# Patient Record
Sex: Female | Born: 1955 | Race: White | Hispanic: No | Marital: Married | Smoking: Former smoker
Health system: Southern US, Community
[De-identification: ages and names within clinical notes are randomized; demographics above are authoritative.]

## PROBLEM LIST (undated history)

## (undated) DIAGNOSIS — D649 Anemia, unspecified: Secondary | ICD-10-CM

## (undated) DIAGNOSIS — F32A Depression, unspecified: Secondary | ICD-10-CM

## (undated) DIAGNOSIS — H269 Unspecified cataract: Secondary | ICD-10-CM

## (undated) DIAGNOSIS — M797 Fibromyalgia: Secondary | ICD-10-CM

## (undated) DIAGNOSIS — E039 Hypothyroidism, unspecified: Secondary | ICD-10-CM

## (undated) DIAGNOSIS — R519 Headache, unspecified: Secondary | ICD-10-CM

## (undated) DIAGNOSIS — I10 Essential (primary) hypertension: Secondary | ICD-10-CM

## (undated) DIAGNOSIS — E079 Disorder of thyroid, unspecified: Secondary | ICD-10-CM

## (undated) DIAGNOSIS — T7840XA Allergy, unspecified, initial encounter: Secondary | ICD-10-CM

## (undated) DIAGNOSIS — Z8742 Personal history of other diseases of the female genital tract: Secondary | ICD-10-CM

## (undated) HISTORY — DX: Disorder of thyroid, unspecified: E07.9

## (undated) HISTORY — DX: Essential (primary) hypertension: I10

## (undated) HISTORY — DX: Personal history of other diseases of the female genital tract: Z87.42

## (undated) HISTORY — DX: Fibromyalgia: M79.7

## (undated) HISTORY — PX: CATARACT EXTRACTION W/ INTRAOCULAR LENS IMPLANT: SHX1309

## (undated) HISTORY — DX: Depression, unspecified: F32.A

## (undated) HISTORY — DX: Allergy, unspecified, initial encounter: T78.40XA

## (undated) HISTORY — DX: Unspecified cataract: H26.9

---
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
## 1985-03-13 HISTORY — PX: DIAGNOSTIC LAPAROSCOPY: SUR761

## 1985-03-13 HISTORY — PX: EXPLORATORY LAPAROTOMY: SUR591

## 1999-05-19 ENCOUNTER — Encounter (HOSPITAL_COMMUNITY): Admission: RE | Admit: 1999-05-19 | Discharge: 1999-08-17 | Payer: Self-pay | Admitting: Family Medicine

## 1999-08-24 ENCOUNTER — Encounter (HOSPITAL_COMMUNITY): Admission: RE | Admit: 1999-08-24 | Discharge: 1999-11-22 | Payer: Self-pay | Admitting: Family Medicine

## 1999-12-07 ENCOUNTER — Encounter (HOSPITAL_COMMUNITY): Admission: RE | Admit: 1999-12-07 | Discharge: 2000-03-06 | Payer: Self-pay | Admitting: Family Medicine

## 2000-04-30 ENCOUNTER — Other Ambulatory Visit: Admission: RE | Admit: 2000-04-30 | Discharge: 2000-04-30 | Payer: Self-pay | Admitting: Obstetrics & Gynecology

## 2001-07-26 ENCOUNTER — Other Ambulatory Visit: Admission: RE | Admit: 2001-07-26 | Discharge: 2001-07-26 | Payer: Self-pay | Admitting: Obstetrics & Gynecology

## 2001-08-01 ENCOUNTER — Encounter: Admission: RE | Admit: 2001-08-01 | Discharge: 2001-08-01 | Payer: Self-pay | Admitting: Obstetrics & Gynecology

## 2001-08-01 ENCOUNTER — Encounter: Payer: Self-pay | Admitting: Obstetrics & Gynecology

## 2003-07-24 ENCOUNTER — Ambulatory Visit (HOSPITAL_COMMUNITY): Admission: RE | Admit: 2003-07-24 | Discharge: 2003-07-24 | Payer: Self-pay | Admitting: Family Medicine

## 2003-12-10 ENCOUNTER — Other Ambulatory Visit: Admission: RE | Admit: 2003-12-10 | Discharge: 2003-12-10 | Payer: Self-pay | Admitting: Family Medicine

## 2003-12-23 ENCOUNTER — Encounter (HOSPITAL_COMMUNITY): Admission: RE | Admit: 2003-12-23 | Discharge: 2004-03-12 | Payer: Self-pay | Admitting: Family Medicine

## 2004-03-15 ENCOUNTER — Encounter (HOSPITAL_COMMUNITY): Admission: RE | Admit: 2004-03-15 | Discharge: 2004-06-13 | Payer: Self-pay | Admitting: Family Medicine

## 2019-01-09 DIAGNOSIS — H25811 Combined forms of age-related cataract, right eye: Secondary | ICD-10-CM | POA: Insufficient documentation

## 2019-05-19 ENCOUNTER — Ambulatory Visit: Payer: Self-pay | Attending: Internal Medicine

## 2019-05-19 DIAGNOSIS — Z23 Encounter for immunization: Secondary | ICD-10-CM | POA: Insufficient documentation

## 2019-05-19 NOTE — Progress Notes (Signed)
   Covid-19 Vaccination Clinic  Name:  Emma Hill    MRN: [id number] DOB: [birth year]  05/19/2019  [name] was observed post Covid-19 immunization for 15 minutes without incident. She was provided with Vaccine Information Sheet and instruction to access the V-Safe system.   [name] was instructed to call 911 with any severe reactions post vaccine: Marland Kitchen Difficulty breathing  . Swelling of face and throat  . A fast heartbeat  . A bad rash all over body  . Dizziness and weakness   Immunizations Administered    Name Date Dose VIS Date Route   Pfizer COVID-19 Vaccine 05/19/2019  3:45 PM 0.3 mL 02/21/2019 Intramuscular   Manufacturer: [place]   Lot: [id number]   [employer]: [id number]

## 2019-06-18 ENCOUNTER — Ambulatory Visit: Payer: Self-pay | Attending: Internal Medicine

## 2019-06-18 DIAGNOSIS — Z23 Encounter for immunization: Secondary | ICD-10-CM

## 2019-06-18 NOTE — Progress Notes (Signed)
   Covid-19 Vaccination Clinic  Name:  KYMBERLIE DRAVIS    MRN: [id number] DOB: 03/03/1956  06/18/2019  Ms. Lookabaugh was observed post Covid-19 immunization for 15 minutes without incident. She was provided with Vaccine Information Sheet and instruction to access the V-Safe system.   Ms. Maginnis was instructed to call 911 with any severe reactions post vaccine: Marland Kitchen Difficulty breathing  . Swelling of face and throat  . A fast heartbeat  . A bad rash all over body  . Dizziness and weakness   Immunizations Administered    Name Date Dose VIS Date Route   Pfizer COVID-19 Vaccine 06/18/2019  3:16 PM 0.3 mL 02/21/2019 Intramuscular   Manufacturer: [place]   Lot: [id number]   [place]: [id number]

## 2020-06-11 DIAGNOSIS — I10 Essential (primary) hypertension: Secondary | ICD-10-CM | POA: Insufficient documentation

## 2020-06-11 DIAGNOSIS — M797 Fibromyalgia: Secondary | ICD-10-CM | POA: Insufficient documentation

## 2020-06-11 DIAGNOSIS — J302 Other seasonal allergic rhinitis: Secondary | ICD-10-CM | POA: Insufficient documentation

## 2020-06-11 DIAGNOSIS — G43909 Migraine, unspecified, not intractable, without status migrainosus: Secondary | ICD-10-CM | POA: Insufficient documentation

## 2020-06-11 DIAGNOSIS — K900 Celiac disease: Secondary | ICD-10-CM | POA: Insufficient documentation

## 2020-09-27 ENCOUNTER — Other Ambulatory Visit: Payer: Self-pay | Admitting: Obstetrics and Gynecology

## 2020-09-27 DIAGNOSIS — R1031 Right lower quadrant pain: Secondary | ICD-10-CM

## 2020-10-26 ENCOUNTER — Inpatient Hospital Stay: Admission: RE | Admit: 2020-10-26 | Payer: No Typology Code available for payment source | Source: Ambulatory Visit

## 2020-11-19 ENCOUNTER — Ambulatory Visit
Admission: RE | Admit: 2020-11-19 | Discharge: 2020-11-19 | Disposition: A | Discharge status: Home / Self Care | Payer: No Typology Code available for payment source | Source: Ambulatory Visit | Attending: Obstetrics and Gynecology | Admitting: Obstetrics and Gynecology

## 2020-11-19 DIAGNOSIS — R1031 Right lower quadrant pain: Secondary | ICD-10-CM

## 2020-11-19 IMAGING — CT CT ABD-PELV W/ CM
1 of 3 series · 14 of 32 positions shown, 19 images · IV contrast (APPLIED)
Comparison: None.

CLINICAL DATA: Right lower quadrant pain. Pelvic ultrasound
demonstrated 3.5 cm irregular right pelvic mass

EXAM:
CT ABDOMEN AND PELVIS WITH CONTRAST
TECHNIQUE: Multidetector CT imaging of the abdomen and pelvis was performed
using the standard protocol following bolus administration of
intravenous contrast.
CONTRAST:  100mL [8W] IOPAMIDOL ([8W]) INJECTION 61%

[Series 2: abd/pelvis w/cm · axial · 0.79mm/px · z∈[-477,-47]mm · 14 of 100 slices shown, 19 images]
[im 7/100  soft-tissue]
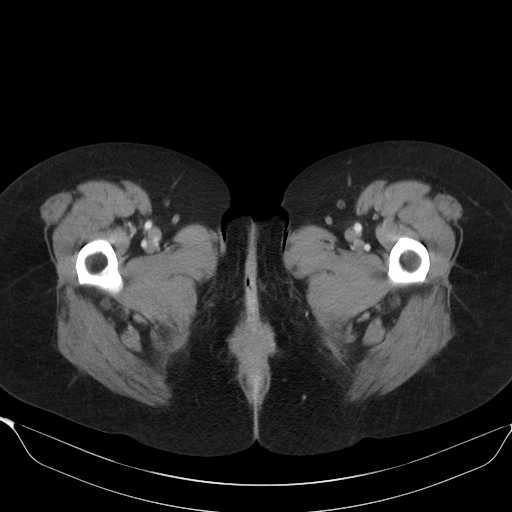
[im 7/100  bone]
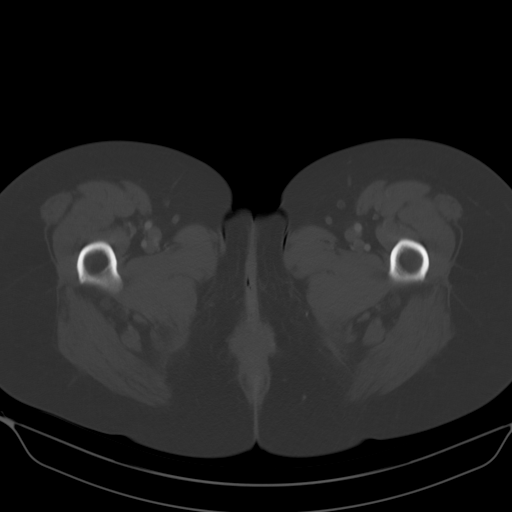
[im 13/100  soft-tissue]
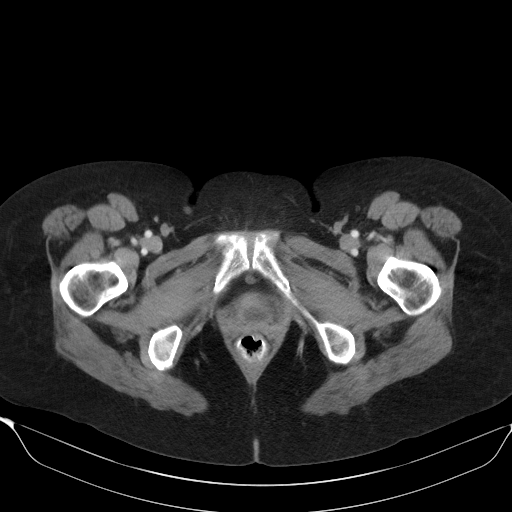
[im 19/100  soft-tissue]
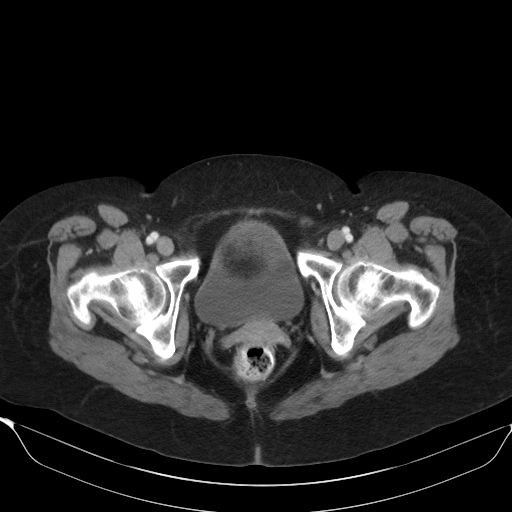
[im 31/100  soft-tissue]
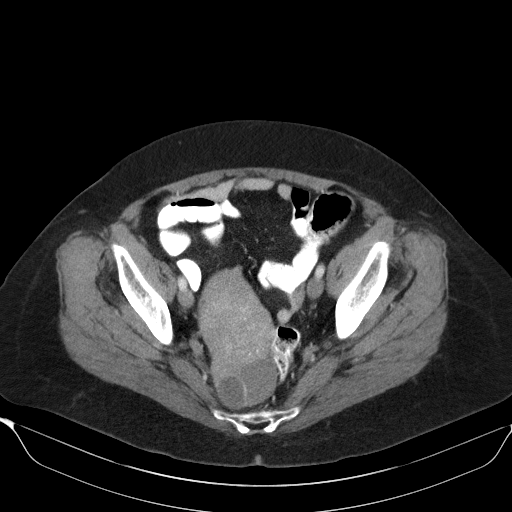
[im 38/100  soft-tissue]
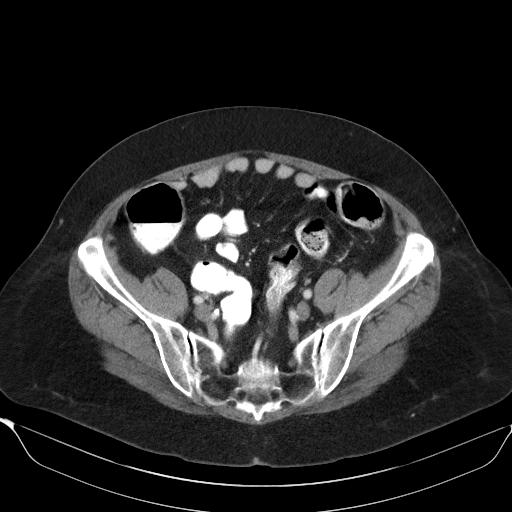
[im 44/100  soft-tissue]
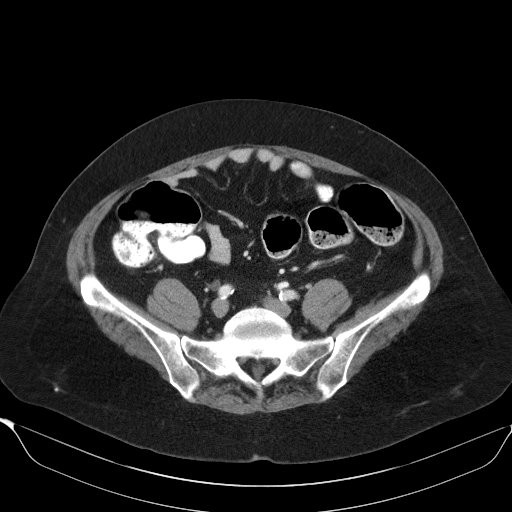
[im 50/100  soft-tissue]
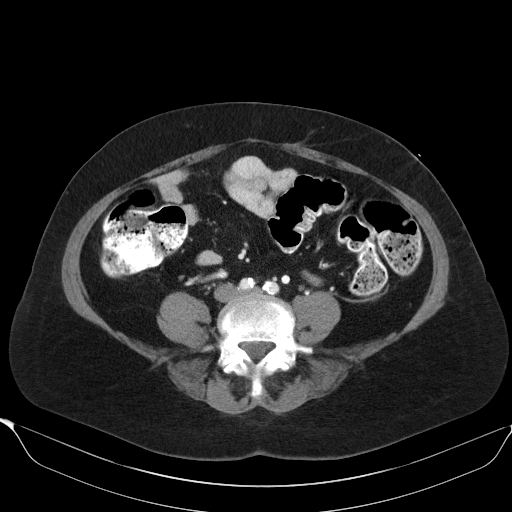
[im 56/100  soft-tissue]
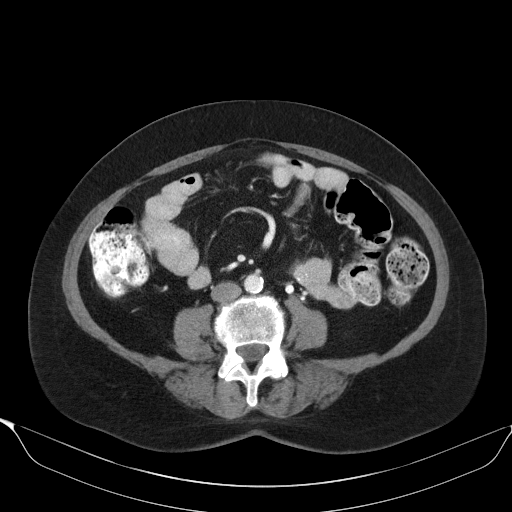
[im 62/100  soft-tissue]
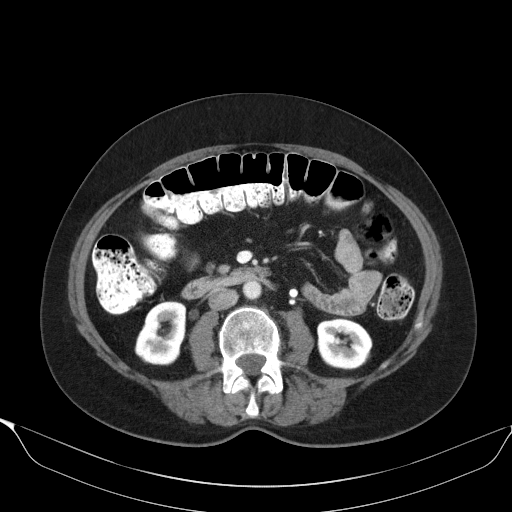
[im 62/100  bone]
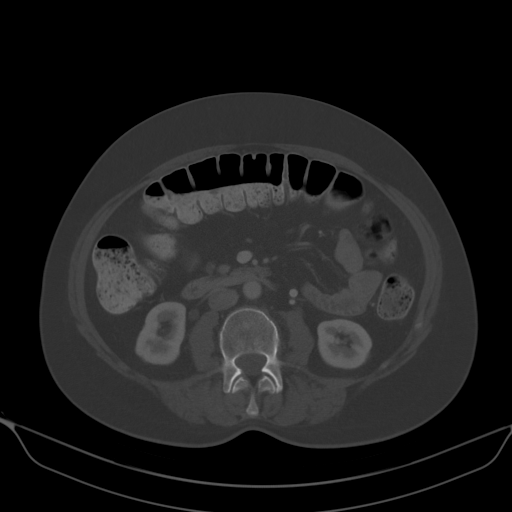
[im 69/100  soft-tissue]
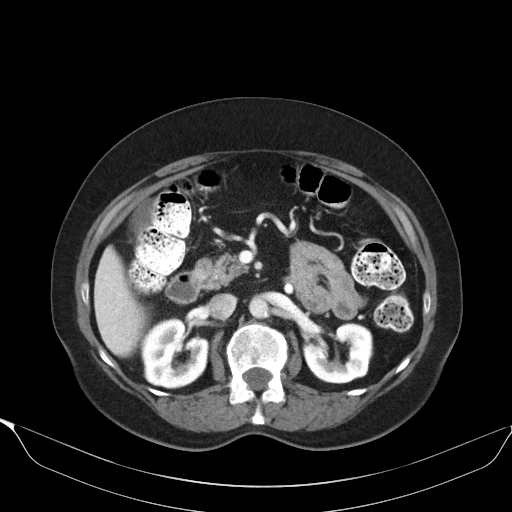
[im 75/100  lung]
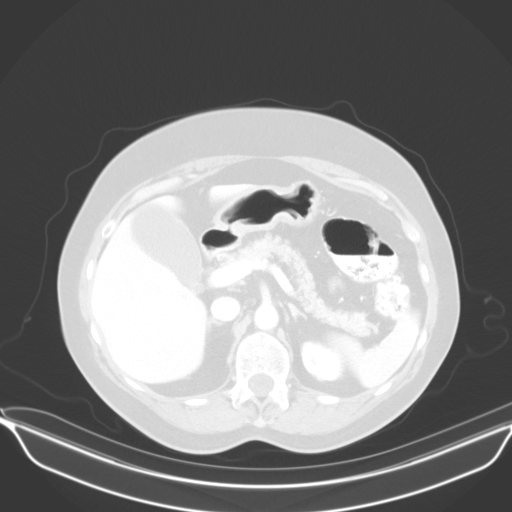
[im 81/100  soft-tissue]
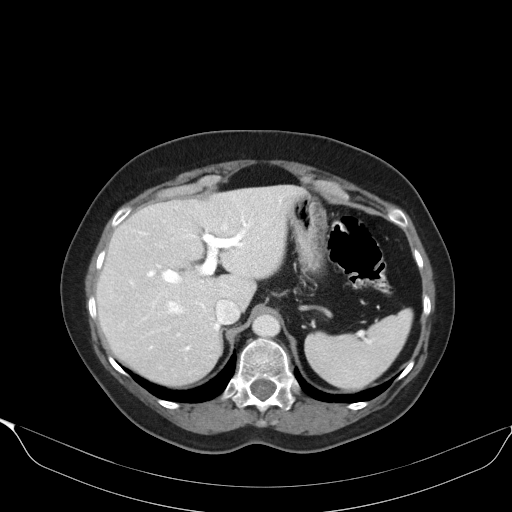
[im 81/100  lung]
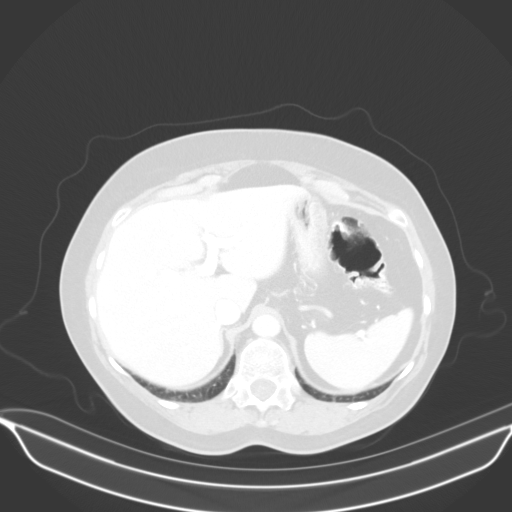
[im 87/100  soft-tissue]
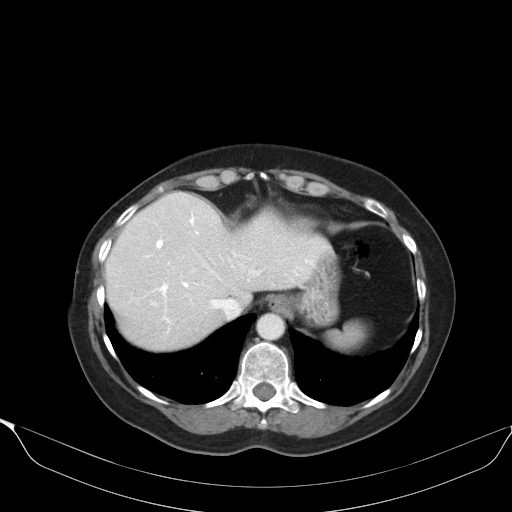
[im 87/100  lung]
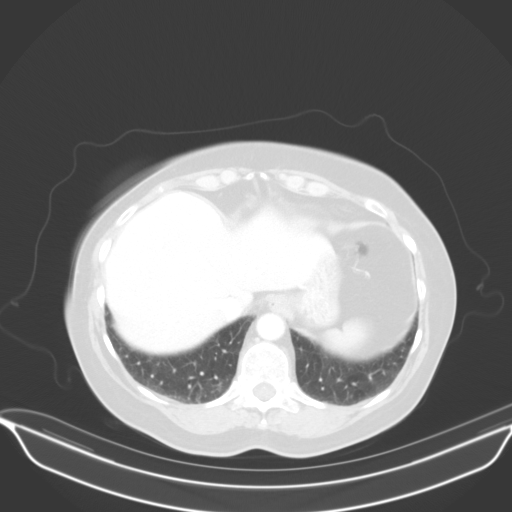
[im 93/100  soft-tissue]
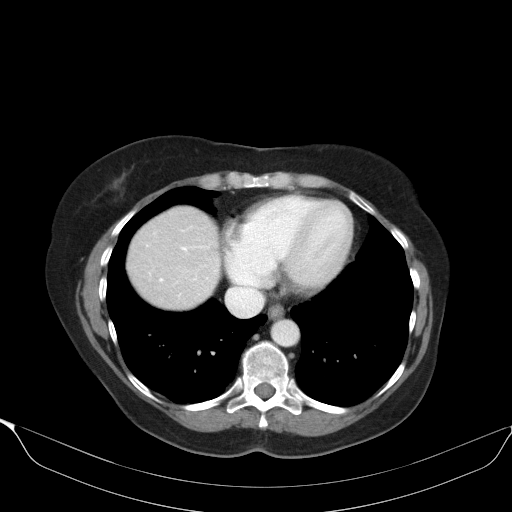
[im 93/100  lung]
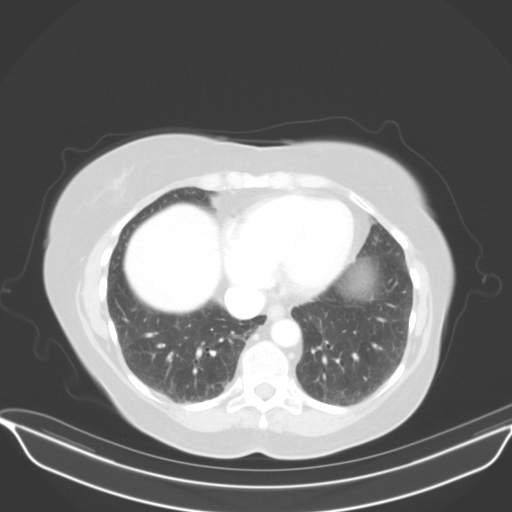

[14 of 32 positions shown; findings below may reference images not displayed]

FINDINGS: Lower chest: No acute abnormality.

Hepatobiliary: No focal hepatic abnormality. Gallbladder
unremarkable.

Pancreas: No focal abnormality or ductal dilatation.

Spleen: No focal abnormality.  Normal size.

Adrenals/Urinary Tract: No adrenal abnormality. No focal renal
abnormality. No stones or hydronephrosis. Urinary bladder is
unremarkable.

Stomach/Bowel: Stomach, large and small bowel grossly unremarkable.

Vascular/Lymphatic: Aortic atherosclerosis. No evidence of aneurysm
or adenopathy.

Reproductive: Within the right adnexal region, there is an 8.3 x
cm solid mass with central calcifications. This is intimately
associated with the right side of the uterus as well as a septated
cystic mass in the posterior pelvis which measures 4.8 x 3.4 cm and
may reflect the right ovary. It is difficult to determine if this
mass arises from the uterus or the right ovary. Left adnexa
unremarkable.

Other: No free fluid or free air.

Musculoskeletal: No acute bony abnormality.
IMPRESSION: Large solid mass in the right side of the pelvis measuring up to
cm abutting the right side of the uterus and AA 4.7 cm septated
cystic mass in the posterior pelvis, likely right ovary. It is
difficult to determine if this solid mass is arising from the uterus
or the right ovary. Recommend further evaluation with MRI.

## 2020-11-19 MED ORDER — IOPAMIDOL (ISOVUE-300) INJECTION 61%
100.0000 mL | Freq: Once | INTRAVENOUS | Status: AC | PRN
  Administered 2020-11-19: 100 mL via INTRAVENOUS

## 2020-11-24 ENCOUNTER — Telehealth: Payer: Self-pay | Admitting: *Deleted

## 2020-11-24 NOTE — Telephone Encounter (Signed)
Called and spoke with the patient, scheduled a new patient appt with Dr Berline Lopes on 9/27 at 11:15 am. Patient given the address and phone number for the clinic, along with the policy for mask/visitors

## 2020-12-02 ENCOUNTER — Other Ambulatory Visit: Payer: Self-pay | Admitting: Nurse Practitioner

## 2020-12-02 DIAGNOSIS — Z13820 Encounter for screening for osteoporosis: Secondary | ICD-10-CM

## 2020-12-02 DIAGNOSIS — Z780 Asymptomatic menopausal state: Secondary | ICD-10-CM

## 2020-12-07 ENCOUNTER — Other Ambulatory Visit: Payer: Self-pay

## 2020-12-07 ENCOUNTER — Inpatient Hospital Stay: Payer: No Typology Code available for payment source | Attending: Gynecologic Oncology | Admitting: Gynecologic Oncology

## 2020-12-07 ENCOUNTER — Encounter: Payer: Self-pay | Admitting: Gynecologic Oncology

## 2020-12-07 VITALS — BP 123/82 | HR 57 | Temp 97.7°F | Resp 16 | Ht 67.0 in | Wt 178.1 lb

## 2020-12-07 DIAGNOSIS — Z8742 Personal history of other diseases of the female genital tract: Secondary | ICD-10-CM | POA: Diagnosis not present

## 2020-12-07 DIAGNOSIS — I10 Essential (primary) hypertension: Secondary | ICD-10-CM | POA: Insufficient documentation

## 2020-12-07 DIAGNOSIS — Z780 Asymptomatic menopausal state: Secondary | ICD-10-CM | POA: Insufficient documentation

## 2020-12-07 DIAGNOSIS — Z90722 Acquired absence of ovaries, bilateral: Secondary | ICD-10-CM | POA: Insufficient documentation

## 2020-12-07 DIAGNOSIS — E079 Disorder of thyroid, unspecified: Secondary | ICD-10-CM | POA: Diagnosis not present

## 2020-12-07 DIAGNOSIS — F419 Anxiety disorder, unspecified: Secondary | ICD-10-CM | POA: Diagnosis not present

## 2020-12-07 DIAGNOSIS — M797 Fibromyalgia: Secondary | ICD-10-CM | POA: Diagnosis not present

## 2020-12-07 DIAGNOSIS — Z79899 Other long term (current) drug therapy: Secondary | ICD-10-CM | POA: Insufficient documentation

## 2020-12-07 DIAGNOSIS — N950 Postmenopausal bleeding: Secondary | ICD-10-CM | POA: Diagnosis not present

## 2020-12-07 DIAGNOSIS — R1900 Intra-abdominal and pelvic swelling, mass and lump, unspecified site: Secondary | ICD-10-CM | POA: Insufficient documentation

## 2020-12-07 MED ORDER — LORAZEPAM 0.5 MG PO TABS: 0.5000 mg | ORAL_TABLET | Freq: Three times a day (TID) | ORAL | 0 refills | Status: AC

## 2020-12-07 NOTE — Patient Instructions (Addendum)
It was a pleasure to meet you.  Given significantly different findings on ultrasound and CT scan, I recommend that we get a pelvic MRI.  CT scan is not as good at looking at pelvic organs.  Based on your exam today, I suspect that this mass is part of or attached to the uterus.  Hopefully the MRI will help [place] determine whether this is a uterine fibroid or some other type of mass.  Depending on results, we will discuss the need for surgery.  I am prescribing a one-time dose of Ativan for you to take for your MRI.  If you have any questions, please do not hesitate to call the clinic at [phone].

## 2020-12-07 NOTE — Progress Notes (Signed)
GYNECOLOGIC ONCOLOGY NEW PATIENT CONSULTATION   Patient Name: Emma Hill  Patient Age: 65 y.o. Date of Service: 12/07/2020 Referring Provider: Irene Pap, MD  Primary Care Provider: Pcp, No Consulting Provider: Jeral Pinch, MD   Assessment/Plan:  Postmenopausal patient with history significant for reportedly advanced endometriosis recently found to have solid pelvic mass with cystic component versus second pelvic mass.  The patient and I reviewed details of both her recent ultrasound as well as CT scan.  She and I looked at the CT images together in detail.  Based on my review of the images as well as what I am feeling on her pelvic exam, I do not feel distinction between the posterior mass and her uterus.  On CT images, I favor that this solid mass is a pedunculated fibroid.  The cystic component may be adnexal or may be a component of the solid mass that is degenerating.  The patient is asymptomatic.  I discussed next steps.  Given somewhat discrepant imaging findings as well as the fact that CT imaging is not a great way to assess GYN organs, I recommend that we proceed with an MRI of the pelvis.  Once we have these images, I will call the patient to discuss if and what surgery may be indicated.  Given thin lining on ultrasound and no further postmenopausal bleeding, I agree that endometrial biopsy can deferred unless she has additional postmenopausal bleeding.  We reviewed tumor markers that were obtained earlier this month including CA-125, CEA, and CA 19-9.  While all normal, we discussed the difficulty with using these tests.  CA-125 is not approved for diagnostic purposes.  It can be normal and up to 50% of patients with early stage ovarian cancer and elevated in many noncancerous disease states.  A copy of this note was sent to the patient's referring provider.   65 minutes of total time was spent for this patient encounter, including preparation, face-to-face  counseling with the patient and coordination of care, and documentation of the encounter.  Jeral Pinch, MD  Division of Gynecologic Oncology  Department of Obstetrics and Gynecology  [facility]  ___________________________________________  Chief Complaint: Chief Complaint  Patient presents with   Pelvic mass    History of Present Illness:  Emma Hill is a 65 y.o. y.o. female who is seen in consultation at the request of Dr. Brien Mates for an evaluation of a pelvic mass.  Patient reports menopause at the age of 54.  3-4 months ago, she had very light vaginal spotting for 1 week with mild cramping.  She has had no bleeding or pain since.  She went to her OB/GYN and underwent pelvic ultrasound which showed a thin endometrial lining but evidence of a solid-appearing mass in the right adnexa.  She subsequently underwent CT scan approximately 2 months later with both a solid as well as bilobed cystic mass noted.  Patient reports a good appetite without any nausea, emesis, bloating, or early satiety.  She reports regular bowel and bladder function.  Her surgical history is notable for laparoscopic surgery for infertility and suspected endometriosis.  A mass was seen (she thinks on one of her ovaries) and she had a dermoid cyst removed.  As far as she knows, she kept both ovaries at the time of that surgery.  Shortly after, she underwent laparotomy secondary to what was described as "severe endometriosis" and had endometriosis removed as well as fulgurated at that time.  Subsequently she was  able to achieve pregnancy x2 and had a C-section delivery with 1 of these.  Her endometriosis related surgeries were approximately 35 years ago.  Remembers vaguely being told something about a uterine fibroid after the surgeries.  She does not think that she has had any imaging until this year after her endometriosis surgeries.  Patient lives in [place] with her husband, her  daughter and her daughter's boyfriend.  She denies any tobacco use, reports social alcohol use.  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  PAST MEDICAL HISTORY:  Past Medical History:  Diagnosis Date   Allergy    Cataract    Depression    Fibromyalgia    History of endometriosis    Hypertension    Thyroid disease      PAST SURGICAL HISTORY:  Past Surgical History:  Procedure Laterality Date   CESAREAN SECTION  1988   DIAGNOSTIC LAPAROSCOPY  1987   EXPLORATORY LAPAROTOMY  1987   Reports diagnostic laparoscopy secondary to concern for endometriosis and infertility.  She ultimately returned for laparotomy secondary to significant endometriosis.   TONSILECTOMY, ADENOIDECTOMY, BILATERAL MYRINGOTOMY AND TUBES  1975    OB/GYN HISTORY:  OB History  Gravida Para Term Preterm AB Living  2 2          SAB IAB Ectopic Multiple Live Births               # Outcome Date GA Lbr Len/2nd Weight Sex Delivery Anes PTL Lv  2 Para           1 Para             No LMP recorded.  Age at menarche: 26 Age at menopause: 73 Hx of HRT: No Hx of STDs: Denies Last pap: 2022 -Pap negative, HPV negative History of abnormal pap smears: Reports remote history of abnormal Pap smear with cryotherapy, normal since  SCREENING STUDIES:  Last mammogram: 2022 per her report  Last colonoscopy: 2014  MEDICATIONS: Outpatient Encounter Medications as of 12/07/2020  Medication Sig   atenolol-chlorthalidone (TENORETIC) 50-25 MG tablet atenolol 50 mg-chlorthalidone 25 mg tablet  TAKE 1/2 TABLET BY MOUTH EVERY DAY   augmented betamethasone dipropionate (DIPROLENE-AF) 0.05 % cream betamethasone, augmented 0.05 % topical cream   HYDROCODONE-ACETAMINOPHEN PO hydrocodone 7.5 mg-acetaminophen 650 mg tablet  TAKE 1 TABLET BY MOUTH 4 TIMES A DAY AS NEEDED FOR PAIN   levothyroxine (SYNTHROID) 75 MCG tablet levothyroxine 75 mcg tablet  TAKE 1 TABLET BY MOUTH ON AN EMPTY STOMACH IN THE MORNING ON WEEKDAYS. 28    LORazepam (ATIVAN) 0.5 MG tablet Take 1 tablet (0.5 mg total) by mouth every 8 (eight) hours for 1 dose.   montelukast (SINGULAIR) 10 MG tablet montelukast 10 mg tablet  TAKE 1 TABLET BY MOUTH EVERY DAY AS NEEDED   pregabalin (LYRICA) 75 MG capsule Take 150 mg by mouth every morning.   tiZANidine (ZANAFLEX) 4 MG tablet tizanidine 4 mg tablet  TAKE 1 TABLET BY MOUTH EVERY 8 HOURS AS NEEDED   conjugated estrogens (PREMARIN) vaginal cream Premarin 0.625 mg/gram vaginal cream  INSERT 1 GRAM INTRAVAGINALLY TWICE WEEKLY AT BEDTIME. (Patient not taking: Reported on 12/07/2020)   [DISCONTINUED] atenolol (TENORMIN) 25 MG tablet Take 25 mg by mouth daily.   No facility-administered encounter medications on file as of 12/07/2020.    ALLERGIES:  Allergies  Allergen Reactions   Pollen Extract Cough     FAMILY HISTORY:  Family History  Problem Relation Age of Onset  Lung cancer Brother    Pancreatic cancer Brother    Colon cancer Neg Hx    Uterine cancer Neg Hx    Ovarian cancer Neg Hx    Breast cancer Neg Hx      SOCIAL HISTORY:  Social Connections: Not on file    REVIEW OF SYSTEMS:  Denies appetite changes, fevers, chills, fatigue, unexplained weight changes. Denies hearing loss, neck lumps or masses, mouth sores, ringing in ears or voice changes. Denies cough or wheezing.  Denies shortness of breath. Denies chest pain or palpitations. Denies leg swelling. Denies abdominal distention, pain, blood in stools, constipation, diarrhea, nausea, vomiting, or early satiety. Denies pain with intercourse, dysuria, frequency, hematuria or incontinence. Denies hot flashes, pelvic pain, vaginal bleeding or vaginal discharge.   Denies joint pain, back pain or muscle pain/cramps. Denies itching, rash, or wounds. Denies dizziness, headaches, numbness or seizures. Denies swollen lymph nodes or glands, denies easy bruising or bleeding. Denies anxiety, depression, confusion, or decreased  concentration.  Physical Exam:  Vital Signs for this encounter:  Blood pressure 123/82, pulse (!) 57, temperature 97.7 F (36.5 C), temperature source Oral, resp. rate 16, height 5\' 7"  (1.702 m), weight 178 lb 1.6 oz (80.8 kg), SpO2 98 %. Body mass index is 27.89 kg/m. General: Alert, oriented, no acute distress.  HEENT: Normocephalic, atraumatic. Sclera anicteric.  Chest: Clear to auscultation bilaterally. No wheezes, rhonchi, or rales. Cardiovascular: Regular rate and rhythm, no murmurs, rubs, or gallops.  Abdomen: Normoactive bowel sounds. Soft, nondistended, nontender to palpation. No masses or hepatosplenomegaly appreciated. No palpable fluid wave.  Well-healed incisions. Extremities: Grossly normal range of motion. Warm, well perfused. No edema bilaterally.  Skin: No rashes or lesions.  Lymphatics: No cervical, supraclavicular, or inguinal adenopathy.  GU:  Normal external female genitalia. No lesions. No discharge or bleeding.             Bladder/urethra:  No lesions or masses, well supported bladder             Vagina: No lesions or masses, no discharge or bleeding.             Cervix: Normal appearing, no lesions.             Uterus: Uterus is mobile and moves in conjunction with mass felt posterior to the uterus, firm, with 1 cm small nodule appreciated on the mass with in the posterior cul-de-sac.             Adnexa: No distinct masses appreciated other than midline mass that is indistinguishable from the uterus.  Rectal: No obvious involvement.    LABORATORY AND RADIOLOGIC DATA:  Outside medical records were reviewed to synthesize the above history, along with the history and physical obtained during the visit.   No results found for: WBC, HGB, HCT, PLT, GLUCOSE, CHOL, TRIG, HDL, LDLDIRECT, LDLCALC, ALT, AST, NA, K, CL, CREATININE, BUN, CO2, TSH, PSA, INR, GLUF, HGBA1C, MICROALBUR  Labs from 11/27/2020: CA-125: 11.8 CEA: 2.1 CA 19-9: 5  CT A/P on  11/21/20: IMPRESSION: Large solid mass in the right side of the pelvis measuring up to 8.3 cm abutting the right side of the uterus and AA 4.7 cm septated cystic mass in the posterior pelvis, likely right ovary. It is difficult to determine if this solid mass is arising from the uterus or the right ovary. Recommend further evaluation with MRI.  Pelvic ultrasound at [facility] on 7/15: Uterus measures 3.4 x 3.9 x 2.4 cm with an  endometrial lining of 2.2 mm.  Solid mass in the right adnexa measuring 3.5 x 2.6 x 2.5 cm noted.

## 2020-12-11 ENCOUNTER — Ambulatory Visit (HOSPITAL_COMMUNITY)
Admission: RE | Admit: 2020-12-11 | Discharge: 2020-12-11 | Disposition: A | Discharge status: Home / Self Care | Payer: PRIVATE HEALTH INSURANCE | Source: Ambulatory Visit | Attending: Gynecologic Oncology | Admitting: Gynecologic Oncology

## 2020-12-11 DIAGNOSIS — R1900 Intra-abdominal and pelvic swelling, mass and lump, unspecified site: Secondary | ICD-10-CM | POA: Diagnosis present

## 2020-12-11 IMAGING — MR MR PELVIS WO/W CM
16 of 19 series · 41 of 48 positions shown · IV contrast (8ML GADAVIST)
Comparison: CT imaging from [DATE]. No previous
ultrasound imaging is available for review.

CLINICAL DATA: Uterine fibroids suspected in a 64-year-old female.

EXAM:
MRI PELVIS WITHOUT AND WITH CONTRAST
TECHNIQUE: Multiplanar multisequence MR imaging of the pelvis was performed
both before and after administration of intravenous contrast.
CONTRAST:  8mL GADAVIST GADOBUTROL 1 MMOL/ML IV SOLN

[Series 2: T2 · coronal · 6.0mm · 1.56mm/px · 2 of 30 slices shown (1 of 4)]
[im 1/30]
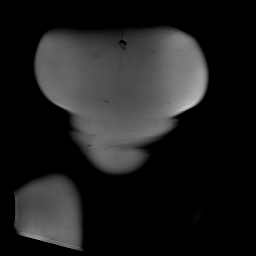
[im 30/30]
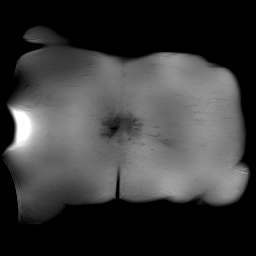

[Series 3: T2 · axial · 5.0mm · 0.51mm/px · z∈[-106,+92]mm · 2 of 34 slices shown (2 of 4)]
[im 1/34]
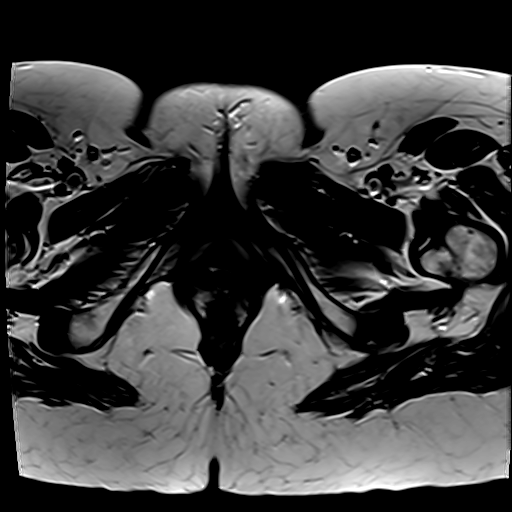
[im 34/34]
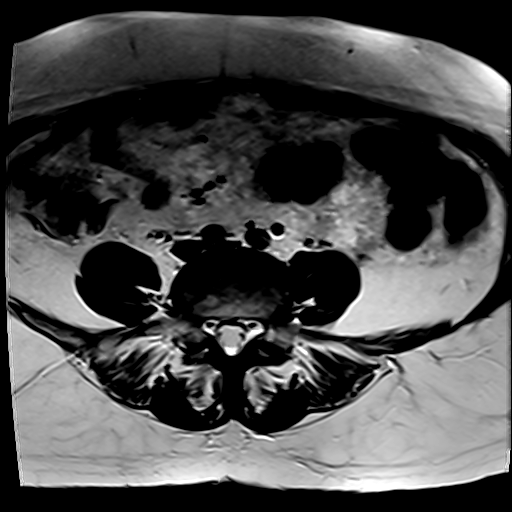

[Series 4: T2 · coronal · 4.0mm · 0.51mm/px · 2 of 34 slices shown (3 of 4)]
[im 1/34]
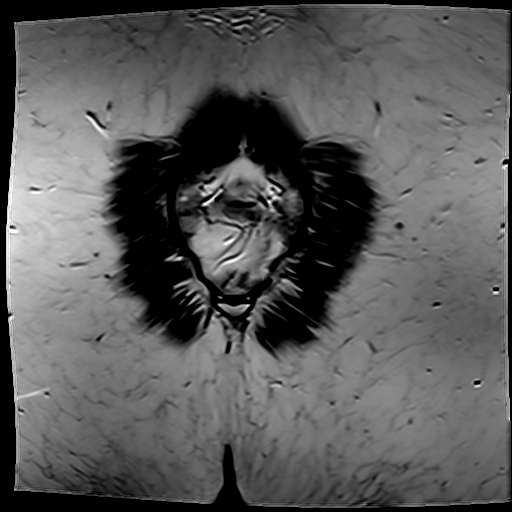
[im 34/34]
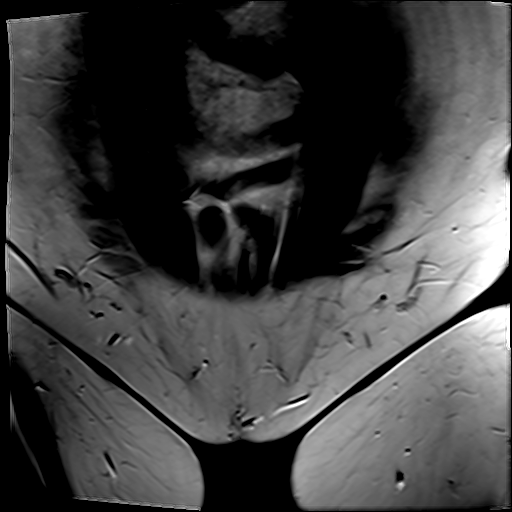

[Series 5: T2 fat-sat · axial · 5.0mm · 0.51mm/px · z∈[-106,+92]mm · 2 of 34 slices shown]
[im 1/34]
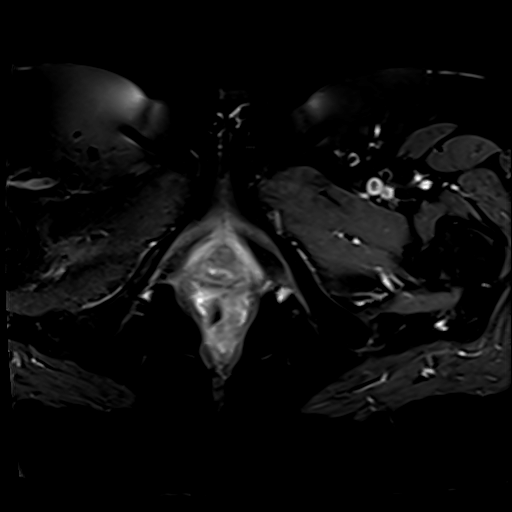
[im 34/34]
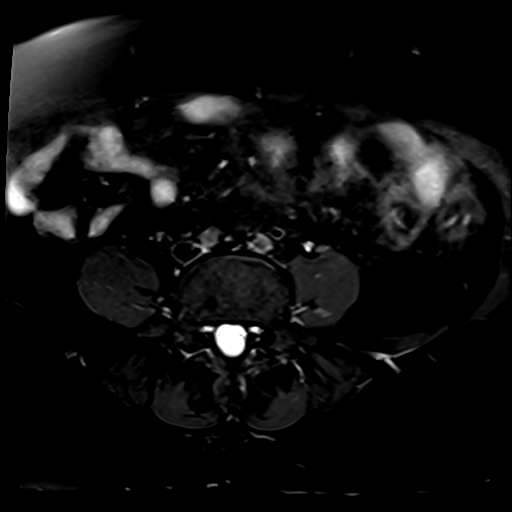

[Series 6: T2 · sagittal · 5.0mm · 0.55mm/px · 3 of 40 slices shown (4 of 4)]
[im 1/40]
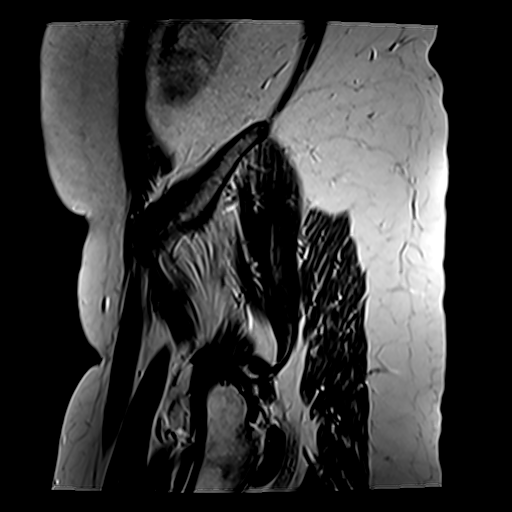
[im 20/40]
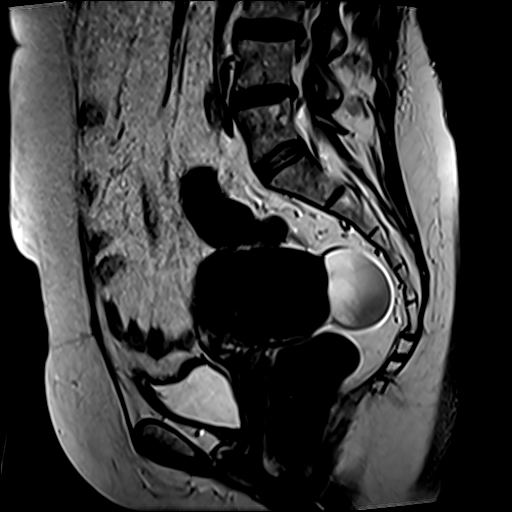
[im 40/40]
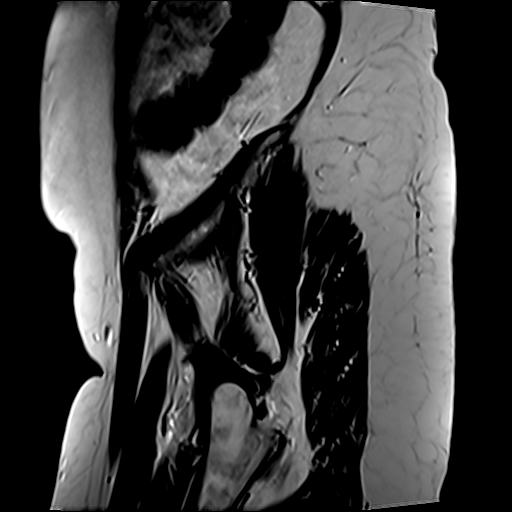

[Series 7: T1 · axial · 4.0mm · 0.84mm/px · z∈[-149,+135]mm · 3 of 72 slices shown (1 of 2)]
[im 1/72]
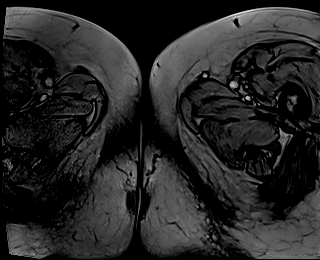
[im 36/72]
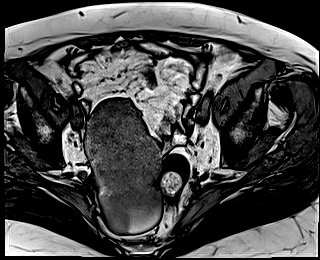
[im 72/72]
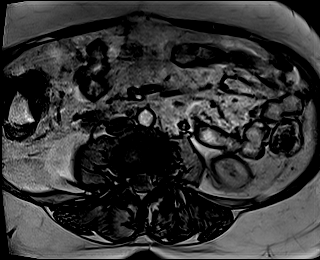

[Series 8: T1 · axial · 4.0mm · 0.84mm/px · z∈[-149,+135]mm · 3 of 72 slices shown (2 of 2)]
[im 1/72]
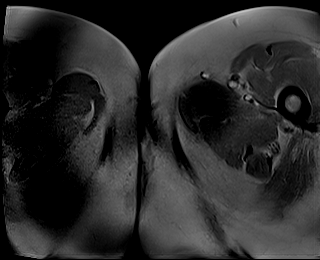
[im 36/72]
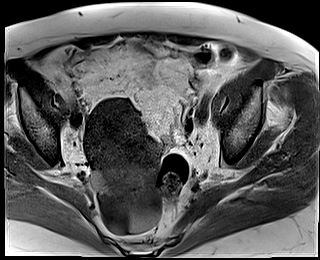
[im 72/72]
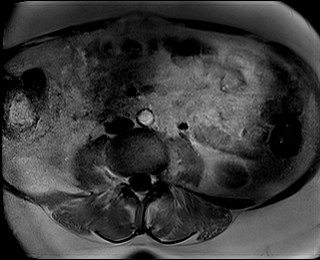

[Series 9: DWI · axial · 5.0mm · 2.80mm/px · z∈[-86,+59]mm · 4 of 89 slices shown (1 of 3)]
[im 1/89]
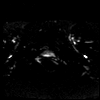
[im 30/89]
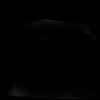
[im 59/89]
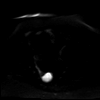
[im 89/89]
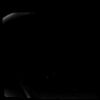

[Series 10: DWI · axial · 5.0mm · 2.80mm/px · 1 of 30 slices shown (2 of 3)]
[im 1/30]
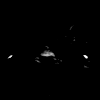

[Series 11: DWI · axial · 5.0mm · 2.80mm/px · 1 of 30 slices shown (3 of 3)]
[im 1/30]
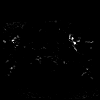

[Series 13: T1 dynamic · axial · 3.0mm · 0.84mm/px · z∈[-138,+99]mm · 3 of 80 slices shown (1 of 5)]
[im 1/80]
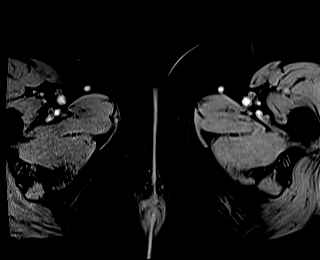
[im 40/80]
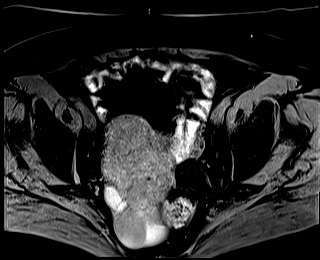
[im 80/80]
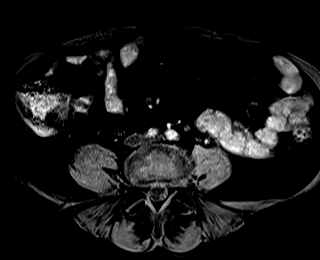

[Series 17: T1 dynamic · axial · 3.0mm · 0.84mm/px · z∈[-138,+99]mm · 3 of 80 slices shown (2 of 5)]
[im 1/80]
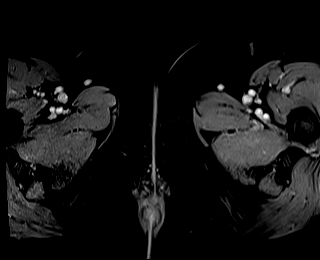
[im 40/80]
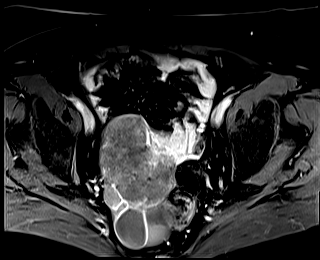
[im 80/80]
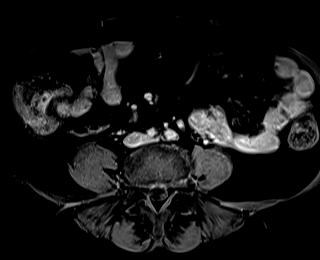

[Series 18: T1 dynamic · axial · 3.0mm · 0.84mm/px · z∈[-138,+99]mm · 3 of 80 slices shown (3 of 5)]
[im 1/80]
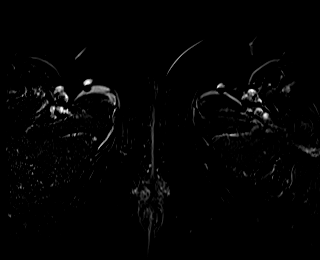
[im 40/80]
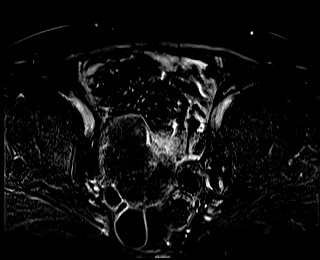
[im 80/80]
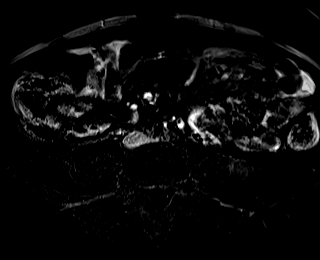

[Series 21: T1 dynamic · axial · 3.0mm · 0.84mm/px · z∈[-138,+99]mm · 3 of 80 slices shown (4 of 5)]
[im 1/80]
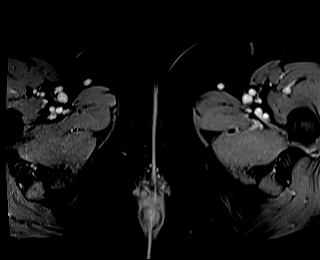
[im 40/80]
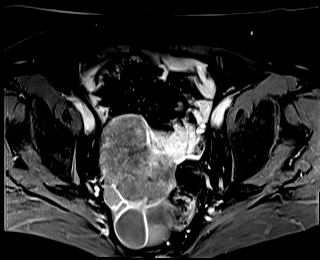
[im 80/80]
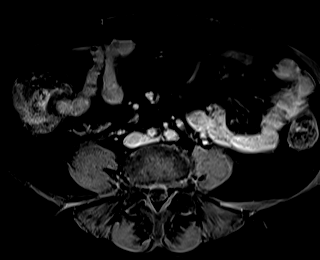

[Series 22: T1 dynamic · axial · 3.0mm · 0.84mm/px · z∈[-138,+99]mm · 3 of 80 slices shown (5 of 5)]
[im 1/80]
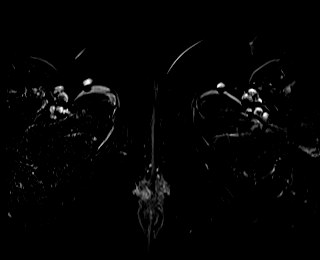
[im 40/80]
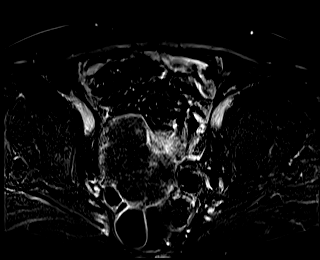
[im 80/80]
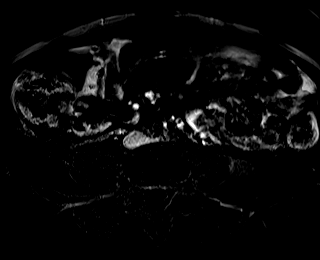

[Series 24: T1 dynamic post-contrast · axial · 3.0mm · 1.06mm/px · z∈[-120,+117]mm · 3 of 80 slices shown]
[im 1/80]
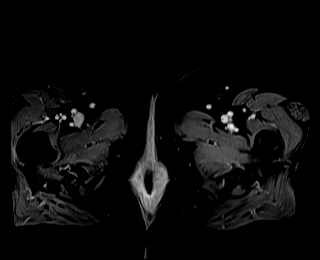
[im 40/80]
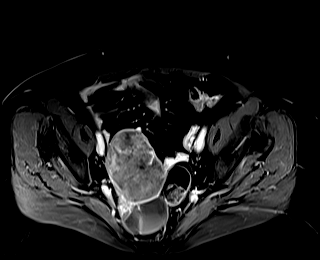
[im 80/80]
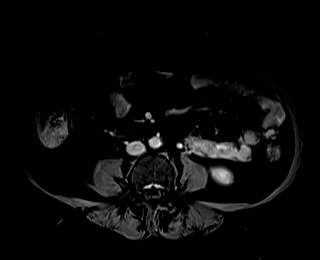

[41 of 48 positions shown; findings below may reference images not displayed]

FINDINGS: Urinary Tract: Urinary bladder has smooth contour. No
hydronephrosis.

Bowel: Bowel unremarkable to the extent evaluated on pelvic MRI.
Stepped down

Vascular/Lymphatic: Patent abdominal vessels. No adenopathy in the
pelvis.

Reproductive:  Uterus measures 5.1 x 3.0 x 3.6 cm.

In the RIGHT adnexa is a lobulated markedly T2 hypointense lesion
measuring 8.2 x 6.9 x 5.4 cm. This is associated with the RIGHT
uterine fundus, broad ligament ovary. This "mass" displays low-level
enhancement much less than adjacent myometrium showing progressive
enhancement over time

Cystic areas are present posterior to this that show T2 shading and
intrinsic T1 hyperintensity, 3 discrete cystic areas display this
appearance. Largest areas are measured in aggregate (image [DATE])
x 3.5 cm. A smaller area on image [DATE] measuring 18 x 11 mm.

Areas display no substantial restricted diffusion. No internal or
nodular enhancement. Thin peripheral enhancement is noted in these
areas. The LEFT ovary is normal.

Other:  No ascites

Musculoskeletal: No suspicious bone lesions identified.
IMPRESSION: Mass associated with RIGHT uterine fundus, broad ligament and ovary
is most suggestive of fibrous ovarian tumor. This is favored based
on the enhancement pattern. Large leiomyoma is considered, felt less
likely due to enhancement pattern

Signs of pelvic cystic endometriomas associated with the RIGHT ovary
and adnexa along the posterior and lateral margin of the "mass".

Otherwise normal appearance of reproductive structures.

Normal LEFT ovary.

## 2020-12-11 MED ORDER — GADOBUTROL 1 MMOL/ML IV SOLN
8.0000 mL | Freq: Once | INTRAVENOUS | Status: AC | PRN
  Administered 2020-12-11: 8 mL via INTRAVENOUS

## 2020-12-14 ENCOUNTER — Ambulatory Visit
Admission: RE | Admit: 2020-12-14 | Discharge: 2020-12-14 | Disposition: A | Discharge status: Home / Self Care | Payer: No Typology Code available for payment source | Source: Ambulatory Visit | Attending: Nurse Practitioner | Admitting: Nurse Practitioner

## 2020-12-14 ENCOUNTER — Other Ambulatory Visit: Payer: Self-pay

## 2020-12-14 DIAGNOSIS — Z13820 Encounter for screening for osteoporosis: Secondary | ICD-10-CM

## 2020-12-14 DIAGNOSIS — Z780 Asymptomatic menopausal state: Secondary | ICD-10-CM

## 2020-12-16 ENCOUNTER — Telehealth: Payer: Self-pay | Admitting: Gynecologic Oncology

## 2020-12-16 DIAGNOSIS — R1900 Intra-abdominal and pelvic swelling, mass and lump, unspecified site: Secondary | ICD-10-CM

## 2020-12-16 DIAGNOSIS — Z8742 Personal history of other diseases of the female genital tract: Secondary | ICD-10-CM

## 2020-12-16 NOTE — Telephone Encounter (Addendum)
Called patient and reviewed MRI findings. Discussed two options - proceed with surgery now vs. close imaging surveillance. The patient has been asymptomatic after her initial week of pain and would prefer to avoid surgery if possible. I think this is reasonable given appearance of solid adnexal mass vs uterine fibroid. Will plan for ultrasound in 3 months and discussion at that time re surgery if things look more concerning/bigger or continued surveillance. The patient will call if she has symptoms prior to ultrasound.  Jeral Pinch MD Gynecologic Oncology

## 2020-12-23 ENCOUNTER — Telehealth: Payer: Self-pay | Admitting: *Deleted

## 2020-12-23 NOTE — Telephone Encounter (Signed)
Scheduled the patient for an [place] in January per Dr Berline Lopes. Called and gave the patient appt date/time

## 2021-03-18 ENCOUNTER — Other Ambulatory Visit: Payer: Self-pay

## 2021-03-18 ENCOUNTER — Ambulatory Visit (HOSPITAL_COMMUNITY)
Admission: RE | Admit: 2021-03-18 | Discharge: 2021-03-18 | Disposition: A | Discharge status: Home / Self Care | Payer: No Typology Code available for payment source | Source: Ambulatory Visit | Attending: Gynecologic Oncology | Admitting: Gynecologic Oncology

## 2021-03-18 DIAGNOSIS — R1900 Intra-abdominal and pelvic swelling, mass and lump, unspecified site: Secondary | ICD-10-CM | POA: Insufficient documentation

## 2021-03-18 DIAGNOSIS — Z8742 Personal history of other diseases of the female genital tract: Secondary | ICD-10-CM | POA: Insufficient documentation

## 2021-03-18 IMAGING — US US PELVIS COMPLETE WITH TRANSVAGINAL
1 series · 14 of 25 positions shown · non-contrast
Comparison: MR pelvis [DATE], CT abdomen and pelvis [DATE]

CLINICAL DATA: Adnexal mass, question ovarian tumor versus fibroid,
history endometriosis



[Series 1: us pelvis complete mc & wl · 148 acquisitions, 14 frames shown]
[im 1/148]
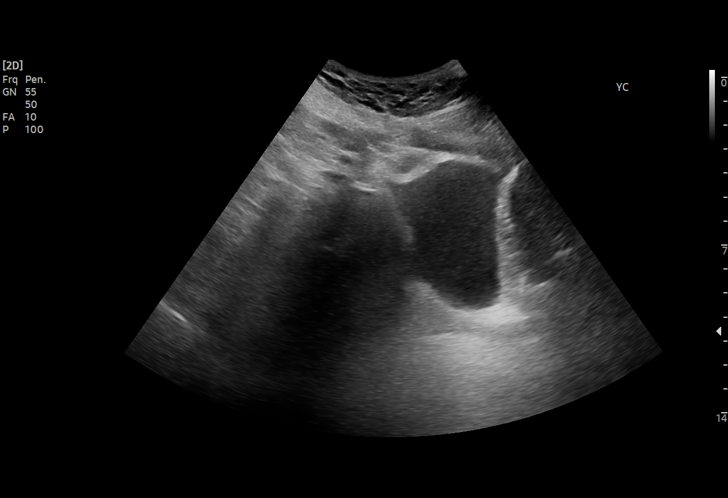
[im 13/148]
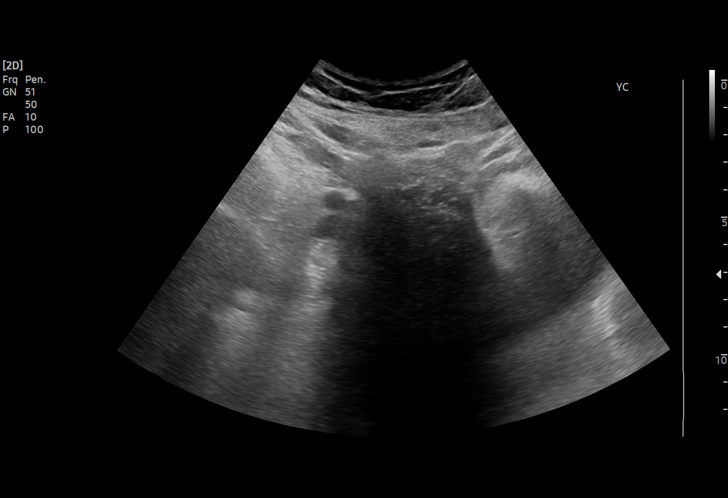
[im 25/148]
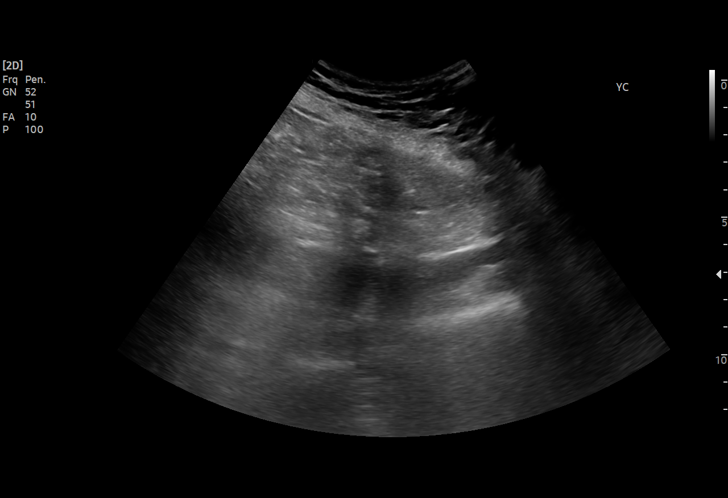
[im 37/148]
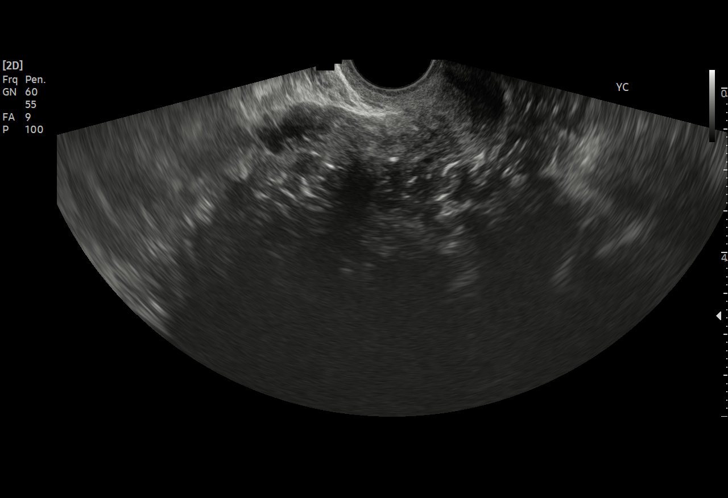
[im 50/148]
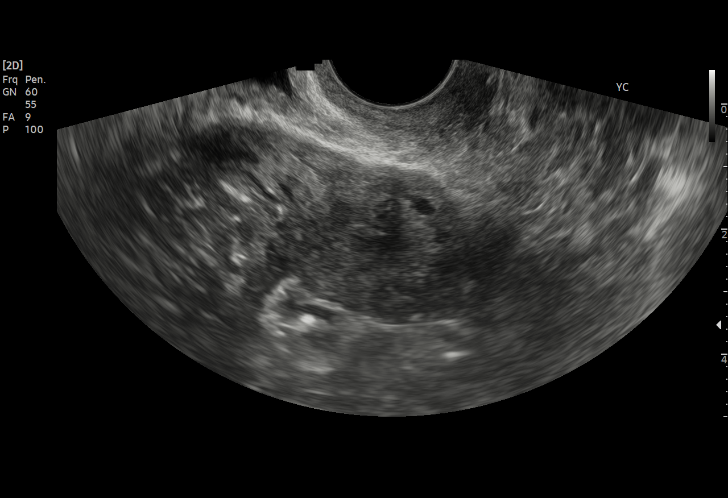
[im 56/148]
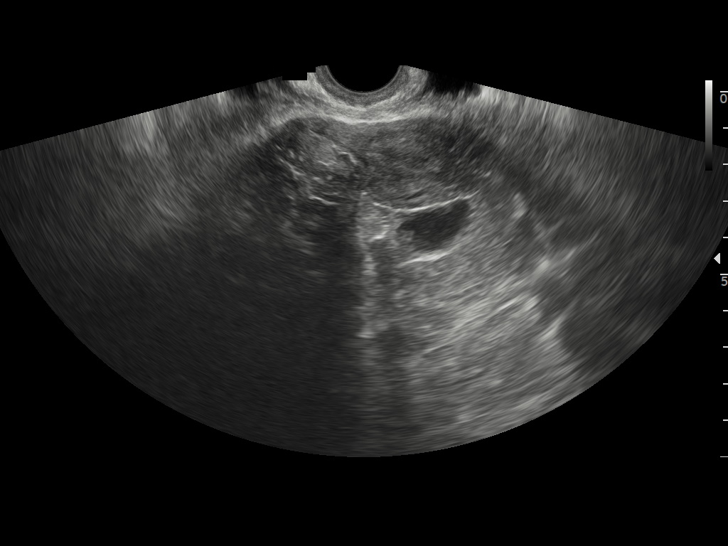
[im 68/148]
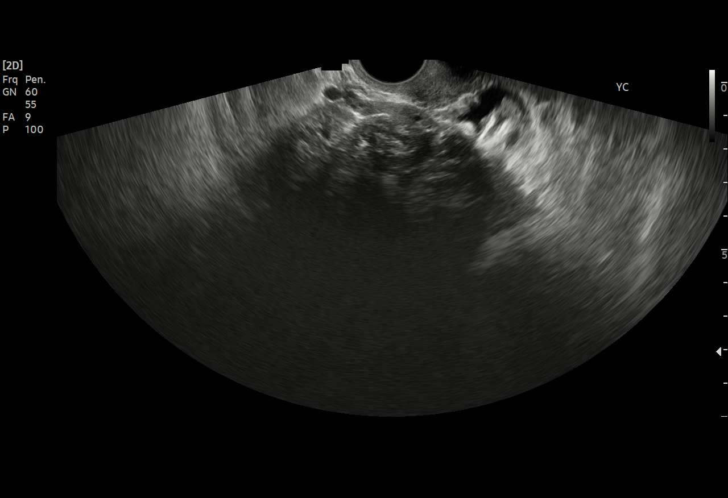
[im 80/148]
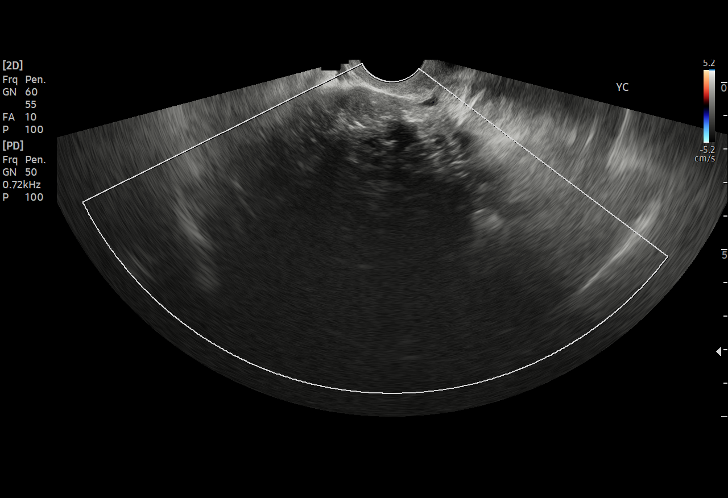
[im 92/148]
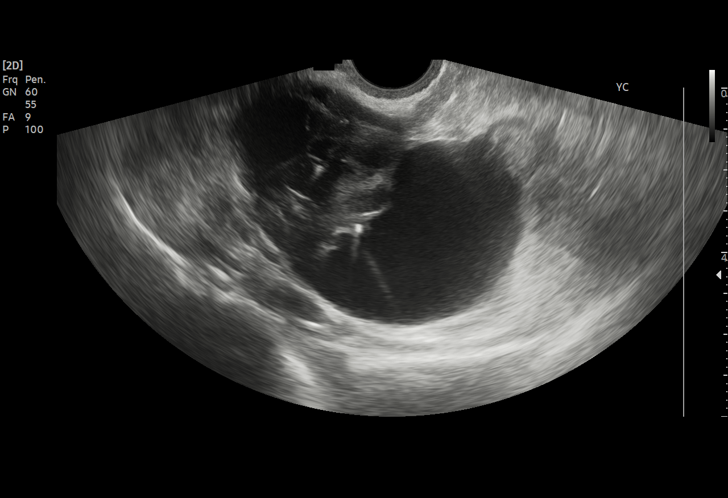
[im 99/148]
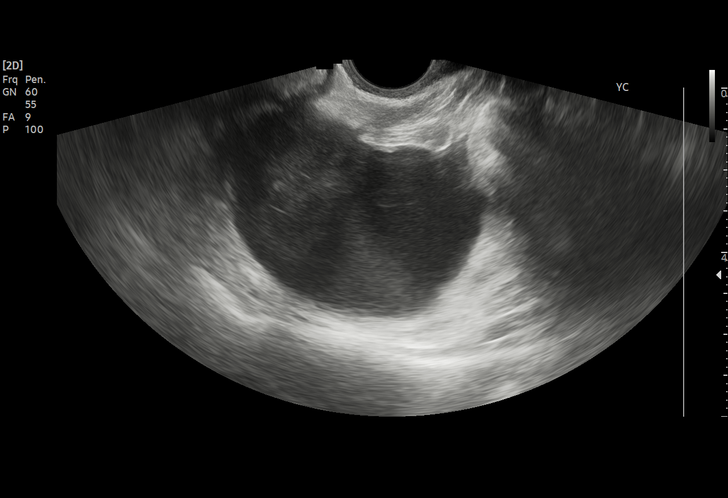
[im 111/148]
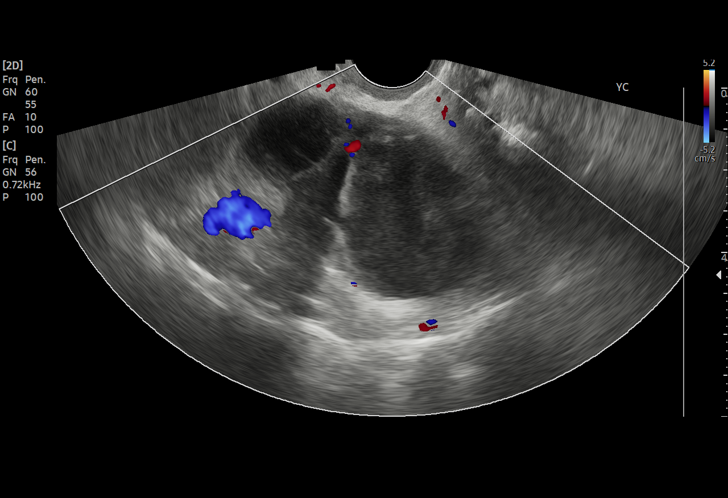
[im 123/148]
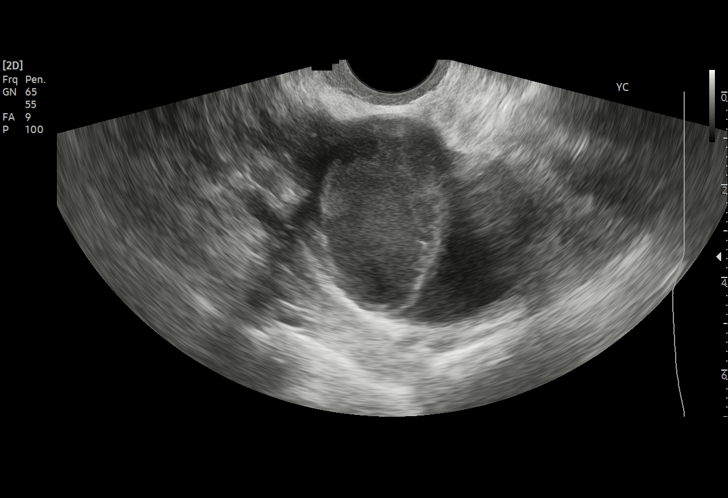
[im 135/148]
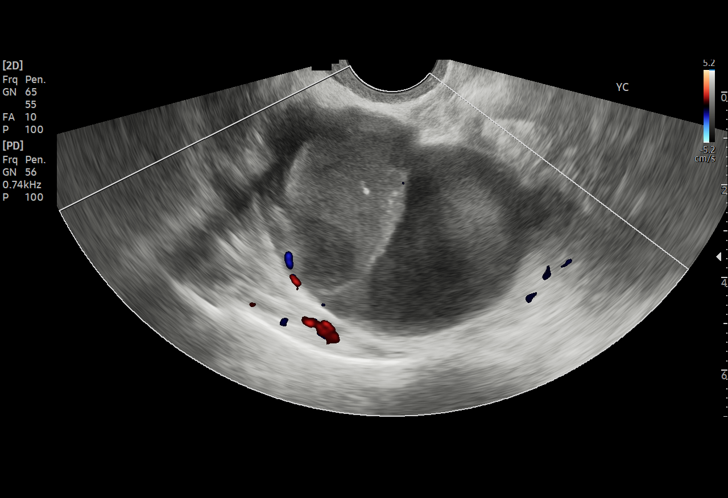
[im 148/148]
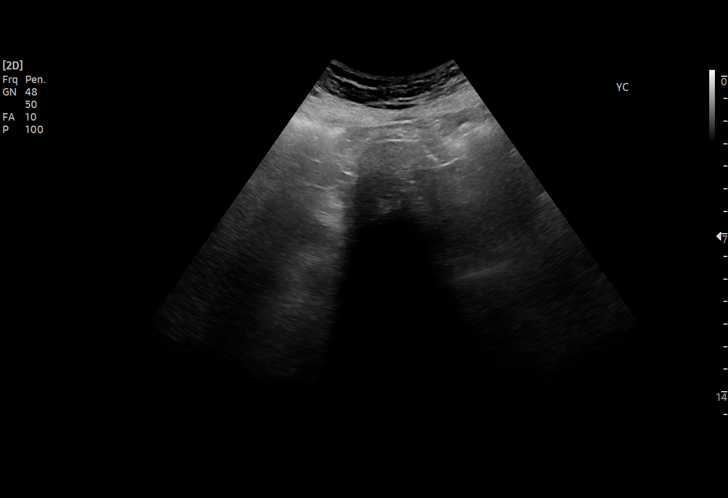

[14 of 25 positions shown; findings below may reference images not displayed]

FINDINGS: Uterus

Measurements: 6.2 x 2.9 x 4.8 cm = volume: 45 mL. Anteverted.
Heterogeneous myometrium. Mass at RIGHT lateral aspect of uterus,
see below.

Endometrium

Thickness: 2 mm.  No endometrial fluid or mass

Right ovary

Measurements: 7.4 x 5.3 x 6.4 cm = volume: 132 mL. 2 complex cystic
areas are identified, 5.5 x 3.9 x 5.2 cm and 4.2 x 2.9 x 3.3 cm.
Both of these demonstrate low level internal echogenicity. These
lesions are suspicious for endometriomas.

Left ovary

Not visualized, likely obscured by bowel

Other findings

RIGHT adnexal mass 8.3 x 7.4 x 8.0 cm. Mass is contiguous with the
RIGHT lateral aspect of the uterus and with the complex cystic
lesions at the RIGHT ovary. Sonographically unable to determine
whether this lesion is of exophytic uterine or RIGHT ovarian origin.
No additional masses. Trace free pelvic fluid.
IMPRESSION: Unremarkable uterus and endometrial complex.

Nonvisualization of LEFT ovary.

Two complex cystic lesions within the RIGHT ovary measuring 5.5 cm
and 4.2 cm in greatest sizes containing diffuse low level internal
echogenicity suspicious for endometriomas in this patient with a
history of endometriosis, though complicated/hemorrhagic cysts could
have a similar appearance.

8.3 cm diameter complex cystic mass at the RIGHT lateral aspect of
the uterus, contiguous with the RIGHT ovary and with the complex
cystic lesion at the RIGHT lateral aspect of the uterus.

Sonographically this lesion cannot be differentiated between an
exophytic uterine mass such as exophytic leiomyoma and a RIGHT
ovarian solid tumor.

MR favored RIGHT ovarian lesion.

Surgical evaluation recommended.

## 2021-03-23 ENCOUNTER — Telehealth: Payer: Self-pay | Admitting: Gynecologic Oncology

## 2021-03-23 NOTE — Telephone Encounter (Signed)
Called patient to discuss recent imaging. No answer. Left VM requesting callback.  Jeral Pinch MD Gynecologic Oncology

## 2021-03-24 ENCOUNTER — Telehealth: Payer: Self-pay

## 2021-03-24 ENCOUNTER — Telehealth: Payer: Self-pay | Admitting: Gynecologic Oncology

## 2021-03-24 NOTE — Telephone Encounter (Signed)
Called the patient back after she'd returned my call this morning. No answer, left VM. I will try her again tomorrow.  Jeral Pinch MD Gynecologic Oncology

## 2021-03-24 NOTE — Telephone Encounter (Signed)
Received call from Ms. Brandvold this morning. Patient returning Dr. Lulu Riding phone call from yesterday. Dr. Berline Lopes notified.

## 2021-03-29 ENCOUNTER — Telehealth: Payer: Self-pay | Admitting: Gynecologic Oncology

## 2021-03-29 DIAGNOSIS — R1900 Intra-abdominal and pelvic swelling, mass and lump, unspecified site: Secondary | ICD-10-CM

## 2021-03-29 DIAGNOSIS — Z8742 Personal history of other diseases of the female genital tract: Secondary | ICD-10-CM

## 2021-03-29 NOTE — Telephone Encounter (Signed)
Was able to reach patient to discuss ultrasound. Stable size of multiple pelvic masses being followed. Patient remains asymptomatic. Discussed continued close surveillance (with MRI in April) vs surgery. Patient prefers to hold off on surgery if not necessary. Will call if she develops any symptoms before April.  Jeral Pinch MD Gynecologic Oncology

## 2021-04-04 ENCOUNTER — Telehealth: Payer: Self-pay | Admitting: *Deleted

## 2021-04-04 NOTE — Telephone Encounter (Signed)
Per Dr Berline Lopes scheduled the patient for an MRI of pelvis with and without contrast for 4/6 at 10 am. Patient also scheduled for a telephone on 4/11 at 4 pm with Dr Berline Lopes. Spoke with the patient and gave the appt dates/times

## 2021-06-16 ENCOUNTER — Ambulatory Visit (HOSPITAL_COMMUNITY)
Admission: RE | Admit: 2021-06-16 | Discharge: 2021-06-16 | Disposition: A | Discharge status: Home / Self Care | Payer: PRIVATE HEALTH INSURANCE | Source: Ambulatory Visit | Attending: Gynecologic Oncology | Admitting: Gynecologic Oncology

## 2021-06-16 ENCOUNTER — Telehealth: Payer: Self-pay | Admitting: Gynecologic Oncology

## 2021-06-16 DIAGNOSIS — R1900 Intra-abdominal and pelvic swelling, mass and lump, unspecified site: Secondary | ICD-10-CM | POA: Diagnosis present

## 2021-06-16 DIAGNOSIS — Z8742 Personal history of other diseases of the female genital tract: Secondary | ICD-10-CM | POA: Insufficient documentation

## 2021-06-16 IMAGING — MR MR PELVIS WO/W CM
19 of 20 series · 46 of 48 positions shown · IV contrast (8ML GADAVIST)
Comparison: [DATE]

CLINICAL DATA: Pelvic mass surveillance

EXAM:
MRI PELVIS WITHOUT AND WITH CONTRAST
TECHNIQUE: Multiplanar multisequence MR imaging of the pelvis was performed
both before and after administration of intravenous contrast.
CONTRAST:  8mL GADAVIST GADOBUTROL 1 MMOL/ML IV SOLN

[Series 2: T2 · coronal · 6.0mm · 1.56mm/px · 1 of 30 slices shown (1 of 4)]
[im 1/30]
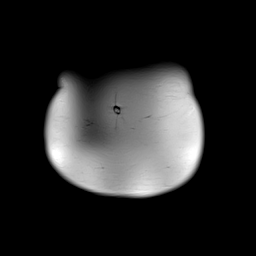

[Series 3: T2 · axial · 5.0mm · 0.51mm/px · 1 of 36 slices shown (2 of 4)]
[im 1/36]
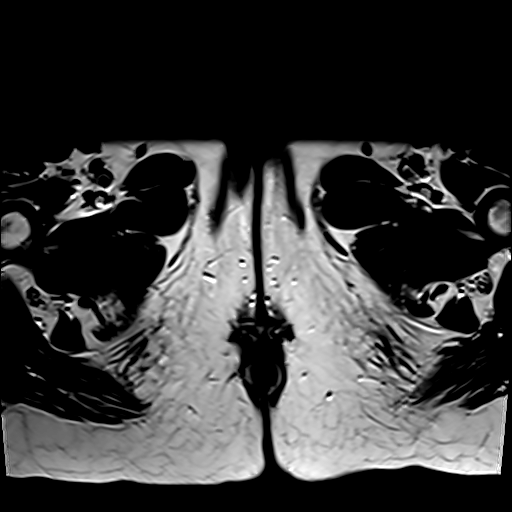

[Series 4: T2 · coronal · 4.0mm · 0.51mm/px · 2 of 40 slices shown (3 of 4)]
[im 1/40]
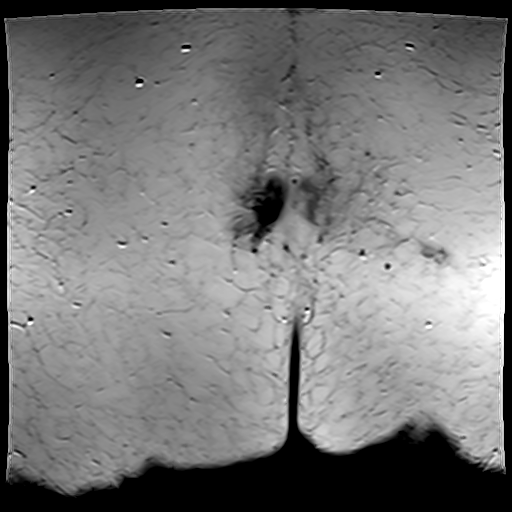
[im 40/40]
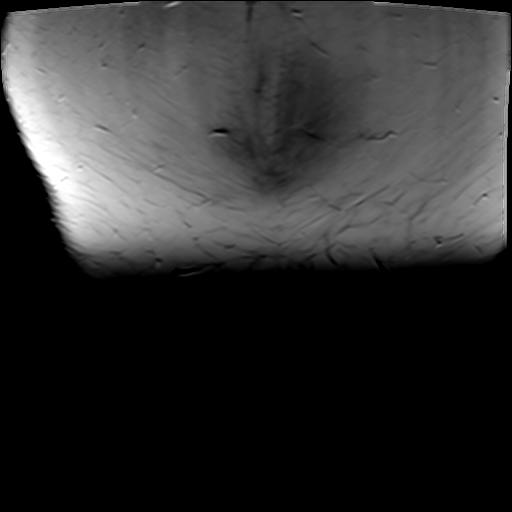

[Series 5: T2 fat-sat · axial · 5.0mm · 0.51mm/px · 1 of 36 slices shown]
[im 1/36]
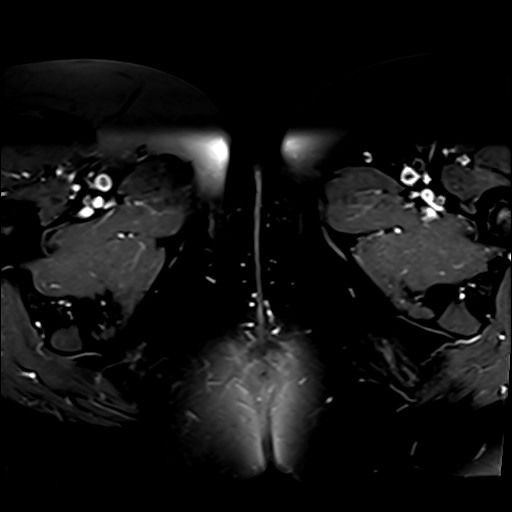

[Series 6: T2 · sagittal · 5.0mm · 0.55mm/px · 2 of 40 slices shown (4 of 4)]
[im 1/40]
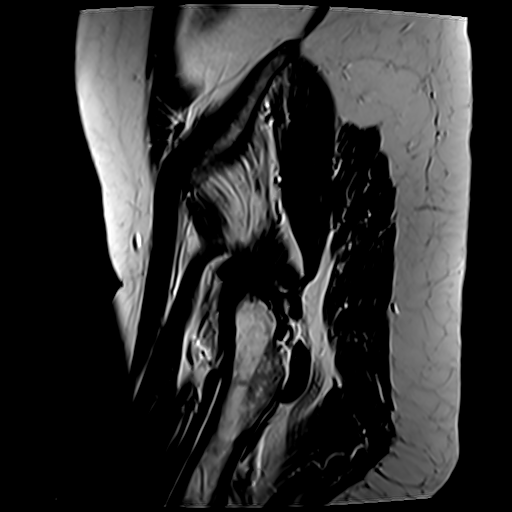
[im 40/40]
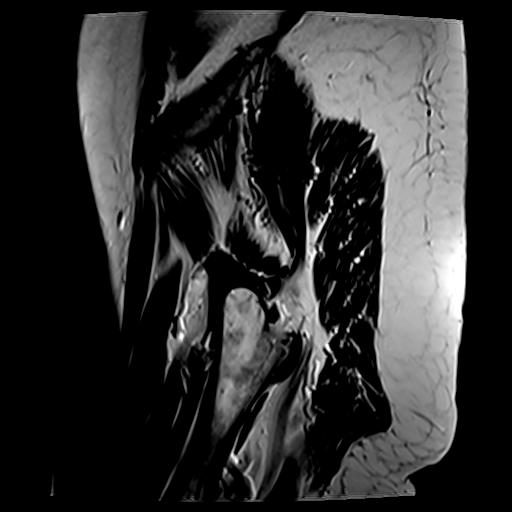

[Series 7: DWI · axial · 5.0mm · 2.80mm/px · z∈[-91,+74]mm · 4 of 97 slices shown (1 of 3)]
[im 1/97]
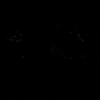
[im 33/97]
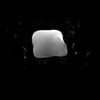
[im 65/97]
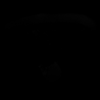
[im 97/97]
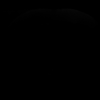

[Series 8: DWI · axial · 5.0mm · 2.80mm/px · 1 of 34 slices shown (2 of 3)]
[im 1/34]
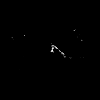

[Series 9: DWI · axial · 5.0mm · 2.80mm/px · 1 of 34 slices shown (3 of 3)]
[im 1/34]
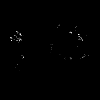

[Series 10: T1 · axial · 4.6mm · 0.93mm/px · z∈[-91,+236]mm · 3 of 72 slices shown (1 of 2)]
[im 1/72]
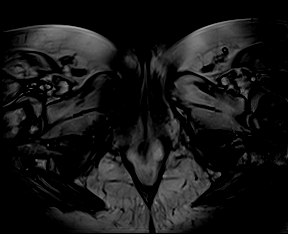
[im 36/72]
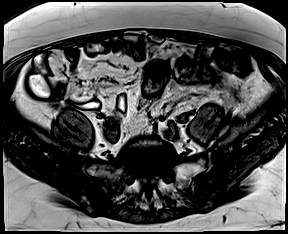
[im 72/72]
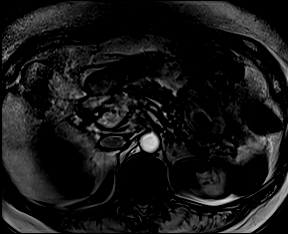

[Series 11: T1 · axial · 4.6mm · 0.93mm/px · z∈[-91,+236]mm · 3 of 72 slices shown (2 of 2)]
[im 1/72]
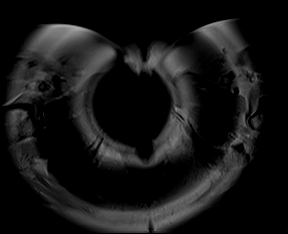
[im 36/72]
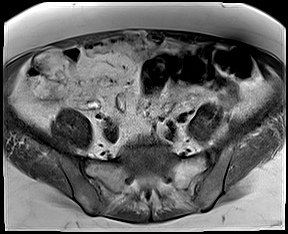
[im 72/72]
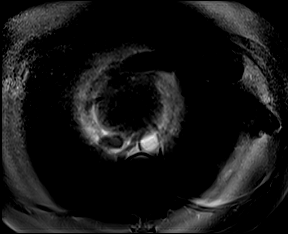

[Series 13: T1 dynamic · axial · 3.0mm · 0.84mm/px · z∈[-102,+111]mm · 3 of 72 slices shown (1 of 7)]
[im 1/72]
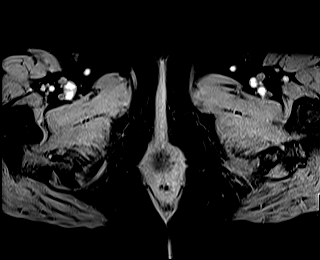
[im 36/72]
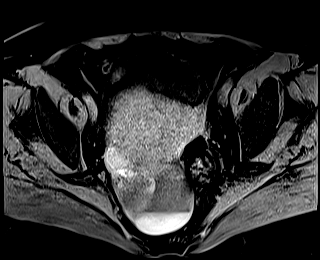
[im 72/72]
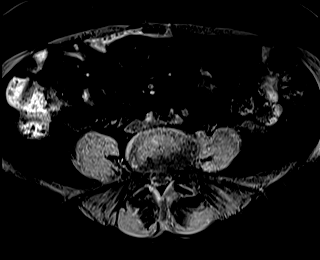

[Series 17: T1 dynamic · axial · 3.0mm · 0.84mm/px · z∈[-102,+111]mm · 3 of 72 slices shown (2 of 7)]
[im 1/72]
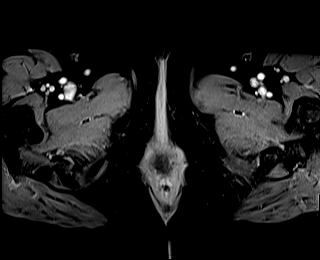
[im 36/72]
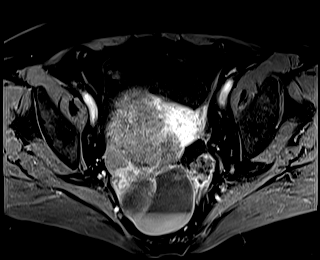
[im 72/72]
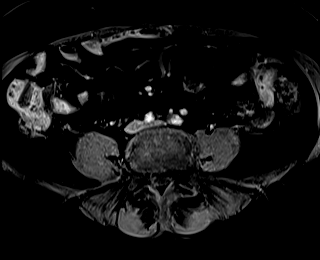

[Series 18: T1 dynamic · axial · 3.0mm · 0.84mm/px · z∈[-102,+111]mm · 3 of 72 slices shown (3 of 7)]
[im 1/72]
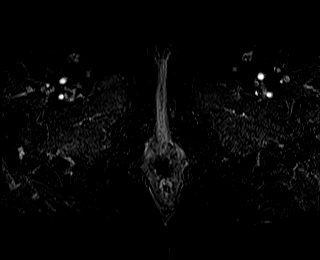
[im 36/72]
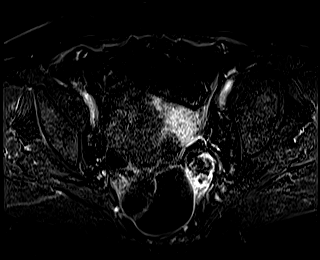
[im 72/72]
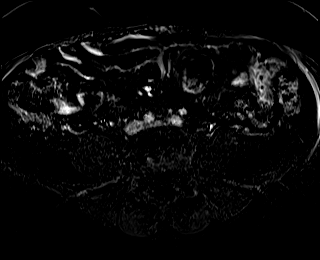

[Series 21: T1 dynamic · axial · 3.0mm · 0.84mm/px · z∈[-102,+111]mm · 3 of 72 slices shown (4 of 7)]
[im 1/72]
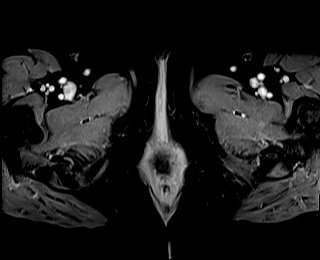
[im 36/72]
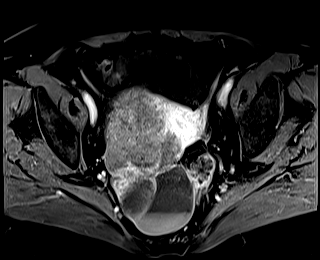
[im 72/72]
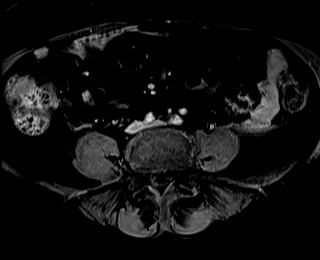

[Series 22: T1 dynamic · axial · 3.0mm · 0.84mm/px · z∈[-102,+111]mm · 3 of 72 slices shown (5 of 7)]
[im 1/72]
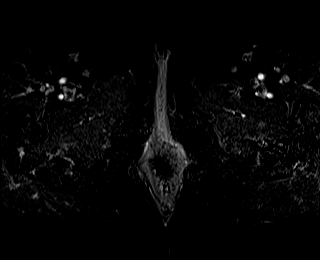
[im 36/72]
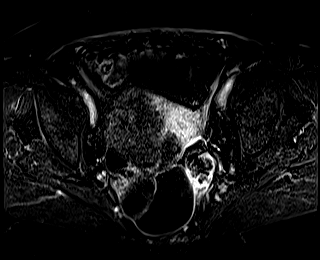
[im 72/72]
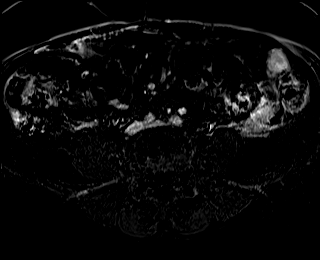

[Series 25: T1 dynamic · axial · 3.0mm · 0.84mm/px · z∈[-102,+111]mm · 3 of 72 slices shown (6 of 7)]
[im 1/72]
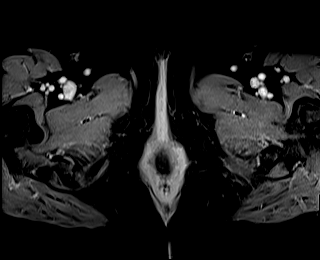
[im 36/72]
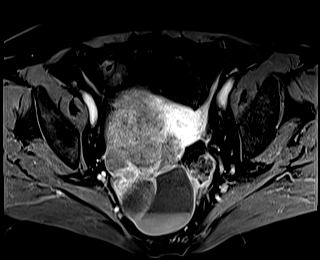
[im 72/72]
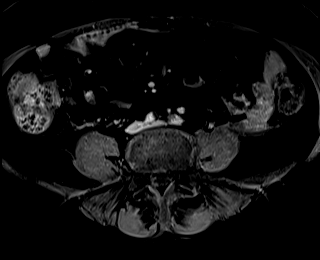

[Series 26: T1 dynamic · axial · 3.0mm · 0.84mm/px · z∈[-102,+111]mm · 3 of 72 slices shown (7 of 7)]
[im 1/72]
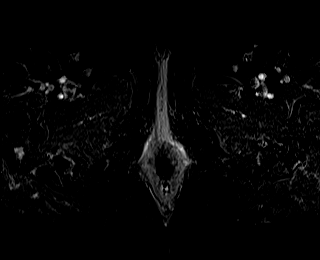
[im 36/72]
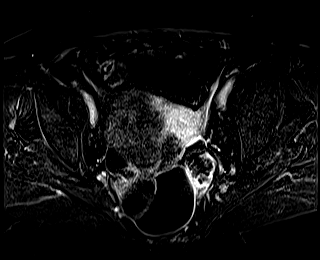
[im 72/72]
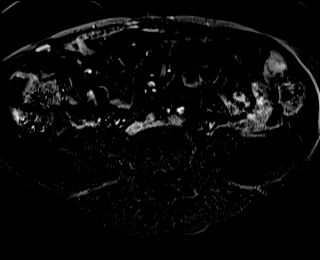

[Series 28: T1 dynamic post-contrast · axial · 3.0mm · 1.06mm/px · z∈[-112,+125]mm · 3 of 80 slices shown (1 of 2)]
[im 1/80]
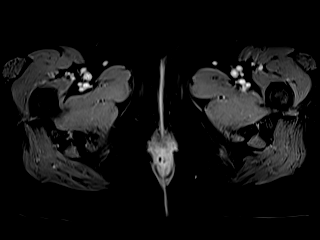
[im 40/80]
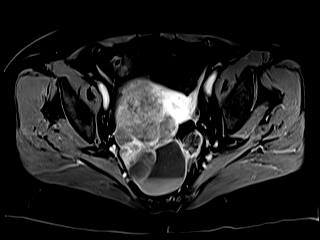
[im 80/80]
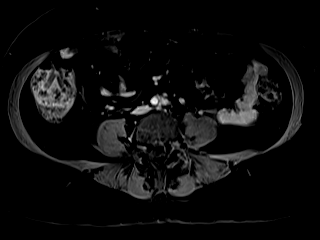

[Series 30: T1 dynamic post-contrast · sagittal · 3.0mm · 0.88mm/px · 3 of 64 slices shown (2 of 2)]
[im 1/64]
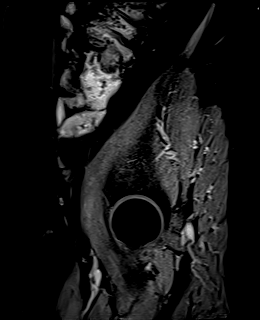
[im 32/64]
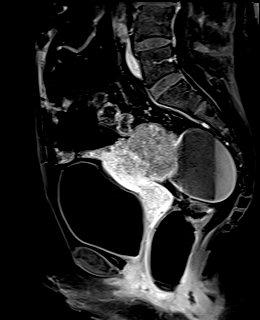
[im 64/64]
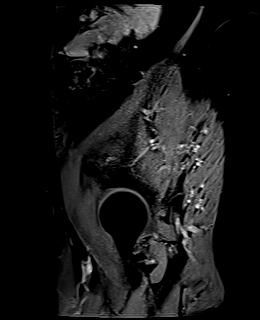

[46 of 48 positions shown; findings below may reference images not displayed]

FINDINGS: Urinary Tract:  No abnormality visualized.

Bowel:  Unremarkable visualized pelvic bowel loops.

Vascular/Lymphatic: No pathologically enlarged lymph nodes. No
significant vascular abnormality seen.

Reproductive: Unchanged large T2 hypointense, poorly enhancing mass
in the vicinity of the right adnexa, closely abutting the right
uterine fundus, measuring 8.6 x 5.7 cm (series 3, image 15).
Multicystic lesion posteriorly in the vicinity of the right ovary or
adnexa with internal T2 shading and FAFATHUN layering, increased in
size compared to prior examination, measuring 6.7 x 5.3 cm,
previously 5.0 x 3.2 cm (series 17, image 40).

Other:  None.

Musculoskeletal: No suspicious bone lesions identified.
IMPRESSION: 1. Unchanged large mass in the vicinity of the right adnexa, closely
abutting the right uterine fundus, again with signal characteristics
consistent with a large ovarian fibroma versus a large, poorly
enhancing uterine leiomyoma. This lesion is most likely benign.
2. Multicystic lesion posteriorly to the above described lesion in
the vicinity of the right ovary or adnexa, increased in overall size
compared to prior examination, measuring 6.7 x 5.3 cm, previously
5.0 x 3.2 cm. Enlargement is worrisome for cystic ovarian malignancy
despite signal characteristics which suggest endometriomas.
Endometriomas generally should not enlarge or fluctuate in size in
the late postmenopausal setting.
3. No evidence of lymphadenopathy or metastatic disease in the
pelvis.

## 2021-06-16 MED ORDER — GADOBUTROL 1 MMOL/ML IV SOLN
8.0000 mL | Freq: Once | INTRAVENOUS | Status: AC | PRN
  Administered 2021-06-16: 8 mL via INTRAVENOUS

## 2021-06-16 NOTE — Telephone Encounter (Signed)
I called the patient to review MRI findings.  On my review, size of both the more solid-appearing lesion just adjacent to the right aspect of the uterus as well as the multicystic lesion posterior to the right ovary and uterus appear similar in size.  The radiologist who read her MRI notes some interval increase in the multicystic lesion posteriorly.  The character and appearance of both areas has not changed.  The comment is that the mass abutting the right uterine fundus has signal characteristics consistent with either an ovarian fibroma versus large, poorly enhancing uterine leiomyoma. ? ?I do not appreciate a significant change in size and the more cystic appearing mass.  Its appearance still looks most consistent with an endometrioma to me.  I think that given perhaps a small increase in size, it would be very reasonable at this time to move forward with definitive surgery, as we have discussed on previous occasions.  There is no evidence of metastatic disease on the MRI.  We discussed what surgery would entail.  At minimum, I would recommend a BSO.  Given her surgical history as well as known endometriosis, we discussed risk related to pelvic adhesions.  The cystic mass is in close proximity to her colon.  My recommendation, given what I think is overall low risk that this represents malignancy, is to err on the side of leaving some of the ovary on the colon if it is densely adherent rather than remove part of her colon.  Plan will be to send the right adnexal mass for frozen section.  If no malignancy identified, then as long as the uterus look normal, hysterectomy would not need to be performed. ? ?The patient would like some time to think about her options.  If we do not move forward with scheduling surgery, I would recommend follow-up interval imaging in 3 months.  She will call back likely next week.  I let her know that if I am not here, she can asked to talk to [facility], who could get her set up  with a date for surgery if she is ready to make the decision to proceed with surgery. ? ?Jeral Pinch MD ?Gynecologic Oncology ? ?

## 2021-06-20 ENCOUNTER — Telehealth: Payer: Self-pay | Admitting: *Deleted

## 2021-06-20 NOTE — Telephone Encounter (Signed)
Returned pt's phone call this afternoon regarding her phone appointment with Dr.Tucker for tomorrow 06/21/21. Pt was wondering if she can cancel it since Dr.Tucker already spoke with her on 06/16/21. Per Dr.Tucker pt may cancel the visit for tomorrow since we are just waiting for her decision regarding surgery or further imagining. Pt stated that she wants to cancel her appointment for tomorrow and she is still deciding on surgery or imagining. She will call us back regarding her decision. Pt verbalized understanding.  ?

## 2021-06-21 ENCOUNTER — Inpatient Hospital Stay: Payer: No Typology Code available for payment source | Admitting: Gynecologic Oncology

## 2021-11-08 ENCOUNTER — Telehealth: Payer: Self-pay

## 2021-11-08 NOTE — Telephone Encounter (Signed)
Pt called today stating she saw Dr.Tucker in April and they discussed possible surgery. Pt states she is ready for the surgery and would like to know the next steps. CB# [phone].  Dr.tucker and Joylene John NP notified

## 2021-11-09 NOTE — Telephone Encounter (Signed)
Per [facility] APP called and spoke with the patient for surgery dates; patient chose 10/19. Follow up appt with Dr Berline Lopes and a pre op appt with [facility] APP scheduled for 10/3

## 2021-12-12 ENCOUNTER — Encounter: Payer: Self-pay | Admitting: Gynecologic Oncology

## 2021-12-13 ENCOUNTER — Telehealth: Payer: Self-pay

## 2021-12-13 ENCOUNTER — Inpatient Hospital Stay (HOSPITAL_BASED_OUTPATIENT_CLINIC_OR_DEPARTMENT_OTHER): Payer: No Typology Code available for payment source | Admitting: Gynecologic Oncology

## 2021-12-13 ENCOUNTER — Inpatient Hospital Stay: Payer: No Typology Code available for payment source | Attending: Gynecologic Oncology | Admitting: Gynecologic Oncology

## 2021-12-13 VITALS — BP 135/75 | HR 60 | Temp 97.8°F | Resp 18 | Ht 66.54 in | Wt 172.7 lb

## 2021-12-13 DIAGNOSIS — Z8742 Personal history of other diseases of the female genital tract: Secondary | ICD-10-CM | POA: Insufficient documentation

## 2021-12-13 DIAGNOSIS — R1900 Intra-abdominal and pelvic swelling, mass and lump, unspecified site: Secondary | ICD-10-CM | POA: Diagnosis present

## 2021-12-13 DIAGNOSIS — Z780 Asymptomatic menopausal state: Secondary | ICD-10-CM | POA: Diagnosis not present

## 2021-12-13 MED ORDER — SENNOSIDES-DOCUSATE SODIUM 8.6-50 MG PO TABS: 2.0000 | ORAL_TABLET | Freq: Every day | ORAL | 0 refills | Status: DC

## 2021-12-13 NOTE — H&P (View-Only) (Signed)
Gynecologic Oncology Return Clinic Visit  12/13/21  Reason for Visit: planning for upcoming surgery.  Treatment History: Patient reports menopause at the age of 21.  3-4 months ago, she had very light vaginal spotting for 1 week with mild cramping.  She has had no bleeding or pain since.  She went to her OB/GYN and underwent pelvic ultrasound which showed a thin endometrial lining but evidence of a solid-appearing mass in the right adnexa.  She subsequently underwent CT scan approximately 2 months later with both a solid as well as bilobed cystic mass noted.  Her surgical history is notable for laparoscopic surgery for infertility and suspected endometriosis.  A mass was seen (she thinks on one of her ovaries) and she had a dermoid cyst removed.  As far as she knows, she kept both ovaries at the time of that surgery.  Shortly after, she underwent laparotomy secondary to what was described as "severe endometriosis" and had endometriosis removed as well as fulgurated at that time.  Subsequently she was able to achieve pregnancy x2 and had a C-section delivery with 1 of these.  Her endometriosis related surgeries were approximately 35 years ago.  Remembers vaguely being told something about a uterine fibroid after the surgeries.  09/24/20: Pelvic ultrasound at [place] on 7/15. Uterus measures 3.4 x 3.9 x 2.4 cm with an endometrial lining of 2.2 mm.  Solid mass in the right adnexa measuring 3.5 x 2.6 x 2.5 cm noted.  11/19/20: CT A/P. Large solid mass in the right side of the pelvis measuring up to 8.3 cm abutting the right side of the uterus and AA 4.7 cm septated cystic mass in the posterior pelvis, likely right ovary. It is difficult to determine if this solid mass is arising from the uterus or the right ovary. Recommend further evaluation with MRI.  Labs on 11/27/20:  CA-125: 11.8 CEA: 2.1 CA 19-9: 5  12/11/2020: Pelvic MRI.  Uterus measures 5.1 x 3 x 3.6 cm.  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  Left ovary is normal in appearance.  No ascites.  No adenopathy.  03/18/2021: Pelvic ultrasound.  Unremarkable uterus.  Nonvisualized left ovary.  2 complex cystic lesions within the right ovary measuring 5.5 cm and 4.2 cm containing diffuse low-level internal echogenicity suspicious for endometriomas, though complicated hemorrhagic cyst could have similar appearance.  8.3 cm diameter complex cystic mass in the right lateral aspect of the uterus, contiguous with the right ovary and the complex cystic lesion at the right lateral aspect of the uterus.  Sonographically, this lesion cannot be differentiated between exophytic uterine mass and a right ovarian solid tumor.  06/16/21: Pelvic MRI.  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  Lesion is most likely benign.  Multicystic lesion posteriorly has increased in size now measuring 6.7 x 5.3 cm.  Enlargement is worrisome for cystic ovarian malignancy despite signal characteristics which suggest endometriomas.  No adenopathy or metastatic disease in the pelvis.  Interval History: Patient reports doing well.  She denies any abdominal or pelvic pain.  She denies any vaginal bleeding.  She reports baseline bowel and bladder function.  Past Medical/Surgical History: Past Medical History:  Diagnosis Date   Allergy    Cataract    Depression    Fibromyalgia    History of endometriosis    Hypertension    Thyroid disease     Past Surgical History:  Procedure Laterality Date   CESAREAN SECTION  1988   DIAGNOSTIC  LAPAROSCOPY  1987   EXPLORATORY LAPAROTOMY  1987   Reports diagnostic laparoscopy secondary to concern for endometriosis and infertility.  She ultimately returned for laparotomy secondary to significant endometriosis.   TONSILECTOMY, ADENOIDECTOMY, BILATERAL MYRINGOTOMY AND TUBES  1975    Family History  Problem Relation Age of Onset   Lung cancer Brother    Pancreatic cancer Brother    Colon cancer Neg Hx    Uterine cancer Neg Hx    Ovarian cancer Neg Hx    Breast cancer Neg Hx     Social History   Socioeconomic History   Marital status: Married    Spouse name: Not on file   Number of children: Not on file   Years of education: Not on file   Highest education level: Not on file  Occupational History   Not on file  Tobacco Use   Smoking status: Former    Types: Cigarettes    Quit date: 1982    Years since quitting: 41.7   Smokeless tobacco: Never  Vaping Use   Vaping Use: Never used  Substance and Sexual Activity   Alcohol use: Yes    Alcohol/week: 14.0 standard drinks of alcohol    Types: 7 Cans of beer, 7 Standard drinks or equivalent per week   Drug use: Never   Sexual activity: Yes  Other Topics Concern   Not on file  Social History Narrative   Not on file   Social Determinants of Health   Financial Resource Strain: Not on file  Food Insecurity: Not on file  Transportation Needs: Not on file  Physical Activity: Not on file  Stress: Not on file  Social Connections: Not on file    Current Medications:  Current Outpatient Medications:    atenolol-chlorthalidone (TENORETIC) 50-25 MG tablet, atenolol 50 mg-chlorthalidone 25 mg tablet  TAKE 1/2 TABLET BY MOUTH EVERY DAY, Disp: , Rfl:    HYDROCODONE-ACETAMINOPHEN PO, hydrocodone 7.5 mg-acetaminophen 650 mg tablet  TAKE 1 TABLET BY MOUTH 4 TIMES A DAY AS NEEDED FOR PAIN, Disp: , Rfl:    levothyroxine (SYNTHROID) 75 MCG tablet, levothyroxine 75 mcg tablet  TAKE 1 TABLET BY MOUTH ON AN EMPTY STOMACH IN THE  MORNING ON WEEKDAYS. 28, Disp: , Rfl:    montelukast (SINGULAIR) 10 MG tablet, montelukast 10 mg tablet  TAKE 1 TABLET BY MOUTH EVERY DAY AS NEEDED, Disp: , Rfl:    pregabalin (LYRICA) 225 MG capsule, Take 225 mg by mouth daily., Disp: , Rfl:    pregabalin (LYRICA) 75 MG capsule, Take 2 capsules by mouth every morning., Disp: , Rfl:    tiZANidine (ZANAFLEX) 4 MG tablet, tizanidine 4 mg tablet  TAKE 1 TABLET BY MOUTH EVERY 8 HOURS AS NEEDED, Disp: , Rfl:    senna-docusate (SENOKOT-S) 8.6-50 MG tablet, Take 2 tablets by mouth at bedtime. For AFTER surgery, do not take if having diarrhea, Disp: 30 tablet, Rfl: 0  Review of Systems: Denies appetite changes, fevers, chills, fatigue, unexplained weight changes. Denies hearing loss, neck lumps or masses, mouth sores, ringing in ears or voice changes. Denies cough or wheezing.  Denies shortness of breath. Denies chest pain or palpitations. Denies leg swelling. Denies abdominal distention, pain, blood in stools, constipation, diarrhea, nausea, vomiting, or early satiety. Denies pain with intercourse, dysuria, frequency, hematuria or incontinence. Denies hot flashes, pelvic pain, vaginal bleeding or vaginal discharge.   Denies joint pain, back pain or muscle pain/cramps. Denies itching, rash, or wounds. Denies dizziness, headaches, numbness or seizures. Denies swollen lymph nodes or glands, denies easy bruising or bleeding. Denies anxiety, depression, confusion, or decreased concentration.  Physical Exam: BP 135/75 (BP Location: Right Arm, Patient Position: Sitting)   Pulse 60   Temp 97.8 F (36.6 C)   Resp 18   Ht 5' 6.54" (1.69 m)   Wt 172 lb 11.2 oz (78.3 kg)   SpO2 98%   BMI 27.43 kg/m  General: Alert, oriented, no acute distress. HEENT: Normocephalic, atraumatic, sclera anicteric. Chest: Clear to auscultation bilaterally.  No wheezes or rhonchi. Cardiovascular: Regular rate and rhythm, no murmurs. Abdomen: soft, nontender.  Normoactive  bowel sounds.  No masses or hepatosplenomegaly appreciated.  Well-healed incisions. Extremities: Grossly normal range of motion.  Warm, well perfused.  No edema bilaterally. Skin: No rashes or lesions noted. Lymphatics: No cervical, supraclavicular, or inguinal adenopathy. GU: Normal appearing external genitalia without erythema, excoriation, or lesions.  Bimanual exam reveals anterior mobile uterus, smooth mass filling the cul-de-sac, moderately mobile especially with abdominal hand.  Rectovaginal exam  confirms findings, rectum does not feel tethered.  Laboratory & Radiologic Studies: New  Assessment & Plan: Emma Hill is a 66 y.o. woman with history of endometriosis and multiple pelvic masses, including a complex cystic mass, likely representing an endometrioma.  Reviewed with the patient and her husband today again findings on imaging.  Although more solid-appearing lesion is likely a benign ovarian tumor or pedunculated fibroid, it is the complex, mostly cystic lesion that is raising the concern for possible malignancy.  While this has features that are consistent with an endometrioma, it has grown some over time, which would be somewhat unusual in the setting of menopause for endometriosis.  The patient had initially wished to avoid any surgery.  Given need for continued surveillance, she is ultimately decided to proceed with definitive surgery.  We had previously discussed bilateral salpingo-oophorectomy with or without concurrent hysterectomy.  In the setting of her endometriosis, I think that it may be better to plan for total hysterectomy with BSO.  If there are any findings that raise the concern for possible malignancy, I will plan to send the adnexal lesion for frozen section.  If a borderline tumor is found, then additional staging procedures including peritoneal biopsies and omentectomy would be performed.  In the setting of malignancy, lymph node sampling would also be  undertaken.  We reviewed the plan for a robotic assisted hysterectomy, bilateral salpingo-oophorectomy, possible staging, possible laparotomy. The risks of surgery were discussed in detail and she understands these to include infection; wound separation; hernia; vaginal cuff separation, injury to adjacent organs such as bowel, bladder, blood vessels, ureters and nerves; bleeding which may require blood transfusion; anesthesia risk; thromboembolic events; possible death; unforeseen complications; possible need for re-exploration; medical complications such as heart attack, stroke, pleural effusion and pneumonia; and, if full lymphadenectomy is performed the risk of lymphedema and lymphocyst. The patient will receive DVT and antibiotic prophylaxis as indicated. She voiced a clear understanding. She had the opportunity to ask questions. Perioperative instructions were reviewed with her. Prescriptions for post-op medications were sent to her pharmacy of choice.  Plan to repeat a CA125 prior to surgery.  28 minutes of total time was spent for this patient encounter, including preparation, face-to-face counseling with the patient and coordination of care, and documentation of the encounter.  Jeral Pinch, MD  Division of Gynecologic Oncology  Department of Obstetrics and Gynecology  [facility]

## 2021-12-13 NOTE — Progress Notes (Signed)
Gynecologic Oncology Return Clinic Visit  12/13/21  Reason for Visit: planning for upcoming surgery.  Treatment History: Patient reports menopause at the age of 64.  3-4 months ago, she had very light vaginal spotting for 1 week with mild cramping.  She has had no bleeding or pain since.  She went to her OB/GYN and underwent pelvic ultrasound which showed a thin endometrial lining but evidence of a solid-appearing mass in the right adnexa.  She subsequently underwent CT scan approximately 2 months later with both a solid as well as bilobed cystic mass noted.  Her surgical history is notable for laparoscopic surgery for infertility and suspected endometriosis.  A mass was seen (she thinks on one of her ovaries) and she had a dermoid cyst removed.  As far as she knows, she kept both ovaries at the time of that surgery.  Shortly after, she underwent laparotomy secondary to what was described as "severe endometriosis" and had endometriosis removed as well as fulgurated at that time.  Subsequently she was able to achieve pregnancy x2 and had a C-section delivery with 1 of these.  Her endometriosis related surgeries were approximately 35 years ago.  Remembers vaguely being told something about a uterine fibroid after the surgeries.  09/24/20: Pelvic ultrasound at [place] on 7/15. Uterus measures 3.4 x 3.9 x 2.4 cm with an endometrial lining of 2.2 mm.  Solid mass in the right adnexa measuring 3.5 x 2.6 x 2.5 cm noted.  11/19/20: CT A/P. Large solid mass in the right side of the pelvis measuring up to 8.3 cm abutting the right side of the uterus and AA 4.7 cm septated cystic mass in the posterior pelvis, likely right ovary. It is difficult to determine if this solid mass is arising from the uterus or the right ovary. Recommend further evaluation with MRI.  Labs on 11/27/20:  CA-125: 11.8 CEA: 2.1 CA 19-9: 5  12/11/2020: Pelvic MRI.  Uterus measures 5.1 x 3 x 3.6 cm.  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  Left ovary is normal in appearance.  No ascites.  No adenopathy.  03/18/2021: Pelvic ultrasound.  Unremarkable uterus.  Nonvisualized left ovary.  2 complex cystic lesions within the right ovary measuring 5.5 cm and 4.2 cm containing diffuse low-level internal echogenicity suspicious for endometriomas, though complicated hemorrhagic cyst could have similar appearance.  8.3 cm diameter complex cystic mass in the right lateral aspect of the uterus, contiguous with the right ovary and the complex cystic lesion at the right lateral aspect of the uterus.  Sonographically, this lesion cannot be differentiated between exophytic uterine mass and a right ovarian solid tumor.  06/16/21: Pelvic MRI.  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  Lesion is most likely benign.  Multicystic lesion posteriorly has increased in size now measuring 6.7 x 5.3 cm.  Enlargement is worrisome for cystic ovarian malignancy despite signal characteristics which suggest endometriomas.  No adenopathy or metastatic disease in the pelvis.  Interval History: Patient reports doing well.  She denies any abdominal or pelvic pain.  She denies any vaginal bleeding.  She reports baseline bowel and bladder function.  Past Medical/Surgical History: Past Medical History:  Diagnosis Date   Allergy    Cataract    Depression    Fibromyalgia    History of endometriosis    Hypertension    Thyroid disease     Past Surgical History:  Procedure Laterality Date   CESAREAN SECTION  1988   DIAGNOSTIC  LAPAROSCOPY  1987   EXPLORATORY LAPAROTOMY  1987   Reports diagnostic laparoscopy secondary to concern for endometriosis and infertility.  She ultimately returned for laparotomy secondary to significant endometriosis.   TONSILECTOMY, ADENOIDECTOMY, BILATERAL MYRINGOTOMY AND TUBES  1975    Family History  Problem Relation Age of Onset   Lung cancer Brother    Pancreatic cancer Brother    Colon cancer Neg Hx    Uterine cancer Neg Hx    Ovarian cancer Neg Hx    Breast cancer Neg Hx     Social History   Socioeconomic History   Marital status: Married    Spouse name: Not on file   Number of children: Not on file   Years of education: Not on file   Highest education level: Not on file  Occupational History   Not on file  Tobacco Use   Smoking status: Former    Types: Cigarettes    Quit date: 1982    Years since quitting: 41.7   Smokeless tobacco: Never  Vaping Use   Vaping Use: Never used  Substance and Sexual Activity   Alcohol use: Yes    Alcohol/week: 14.0 standard drinks of alcohol    Types: 7 Cans of beer, 7 Standard drinks or equivalent per week   Drug use: Never   Sexual activity: Yes  Other Topics Concern   Not on file  Social History Narrative   Not on file   Social Determinants of Health   Financial Resource Strain: Not on file  Food Insecurity: Not on file  Transportation Needs: Not on file  Physical Activity: Not on file  Stress: Not on file  Social Connections: Not on file    Current Medications:  Current Outpatient Medications:    atenolol-chlorthalidone (TENORETIC) 50-25 MG tablet, atenolol 50 mg-chlorthalidone 25 mg tablet  TAKE 1/2 TABLET BY MOUTH EVERY DAY, Disp: , Rfl:    HYDROCODONE-ACETAMINOPHEN PO, hydrocodone 7.5 mg-acetaminophen 650 mg tablet  TAKE 1 TABLET BY MOUTH 4 TIMES A DAY AS NEEDED FOR PAIN, Disp: , Rfl:    levothyroxine (SYNTHROID) 75 MCG tablet, levothyroxine 75 mcg tablet  TAKE 1 TABLET BY MOUTH ON AN EMPTY STOMACH IN THE  MORNING ON WEEKDAYS. 28, Disp: , Rfl:    montelukast (SINGULAIR) 10 MG tablet, montelukast 10 mg tablet  TAKE 1 TABLET BY MOUTH EVERY DAY AS NEEDED, Disp: , Rfl:    pregabalin (LYRICA) 225 MG capsule, Take 225 mg by mouth daily., Disp: , Rfl:    pregabalin (LYRICA) 75 MG capsule, Take 2 capsules by mouth every morning., Disp: , Rfl:    tiZANidine (ZANAFLEX) 4 MG tablet, tizanidine 4 mg tablet  TAKE 1 TABLET BY MOUTH EVERY 8 HOURS AS NEEDED, Disp: , Rfl:    senna-docusate (SENOKOT-S) 8.6-50 MG tablet, Take 2 tablets by mouth at bedtime. For AFTER surgery, do not take if having diarrhea, Disp: 30 tablet, Rfl: 0  Review of Systems: Denies appetite changes, fevers, chills, fatigue, unexplained weight changes. Denies hearing loss, neck lumps or masses, mouth sores, ringing in ears or voice changes. Denies cough or wheezing.  Denies shortness of breath. Denies chest pain or palpitations. Denies leg swelling. Denies abdominal distention, pain, blood in stools, constipation, diarrhea, nausea, vomiting, or early satiety. Denies pain with intercourse, dysuria, frequency, hematuria or incontinence. Denies hot flashes, pelvic pain, vaginal bleeding or vaginal discharge.   Denies joint pain, back pain or muscle pain/cramps. Denies itching, rash, or wounds. Denies dizziness, headaches, numbness or seizures. Denies swollen lymph nodes or glands, denies easy bruising or bleeding. Denies anxiety, depression, confusion, or decreased concentration.  Physical Exam: BP 135/75 (BP Location: Right Arm, Patient Position: Sitting)   Pulse 60   Temp 97.8 F (36.6 C)   Resp 18   Ht 5' 6.54" (1.69 m)   Wt 172 lb 11.2 oz (78.3 kg)   SpO2 98%   BMI 27.43 kg/m  General: Alert, oriented, no acute distress. HEENT: Normocephalic, atraumatic, sclera anicteric. Chest: Clear to auscultation bilaterally.  No wheezes or rhonchi. Cardiovascular: Regular rate and rhythm, no murmurs. Abdomen: soft, nontender.  Normoactive  bowel sounds.  No masses or hepatosplenomegaly appreciated.  Well-healed incisions. Extremities: Grossly normal range of motion.  Warm, well perfused.  No edema bilaterally. Skin: No rashes or lesions noted. Lymphatics: No cervical, supraclavicular, or inguinal adenopathy. GU: Normal appearing external genitalia without erythema, excoriation, or lesions.  Bimanual exam reveals anterior mobile uterus, smooth mass filling the cul-de-sac, moderately mobile especially with abdominal hand.  Rectovaginal exam  confirms findings, rectum does not feel tethered.  Laboratory & Radiologic Studies: New  Assessment & Plan: Emma Hill is a 66 y.o. woman with history of endometriosis and multiple pelvic masses, including a complex cystic mass, likely representing an endometrioma.  Reviewed with the patient and her husband today again findings on imaging.  Although more solid-appearing lesion is likely a benign ovarian tumor or pedunculated fibroid, it is the complex, mostly cystic lesion that is raising the concern for possible malignancy.  While this has features that are consistent with an endometrioma, it has grown some over time, which would be somewhat unusual in the setting of menopause for endometriosis.  The patient had initially wished to avoid any surgery.  Given need for continued surveillance, she is ultimately decided to proceed with definitive surgery.  We had previously discussed bilateral salpingo-oophorectomy with or without concurrent hysterectomy.  In the setting of her endometriosis, I think that it may be better to plan for total hysterectomy with BSO.  If there are any findings that raise the concern for possible malignancy, I will plan to send the adnexal lesion for frozen section.  If a borderline tumor is found, then additional staging procedures including peritoneal biopsies and omentectomy would be performed.  In the setting of malignancy, lymph node sampling would also be  undertaken.  We reviewed the plan for a robotic assisted hysterectomy, bilateral salpingo-oophorectomy, possible staging, possible laparotomy. The risks of surgery were discussed in detail and she understands these to include infection; wound separation; hernia; vaginal cuff separation, injury to adjacent organs such as bowel, bladder, blood vessels, ureters and nerves; bleeding which may require blood transfusion; anesthesia risk; thromboembolic events; possible death; unforeseen complications; possible need for re-exploration; medical complications such as heart attack, stroke, pleural effusion and pneumonia; and, if full lymphadenectomy is performed the risk of lymphedema and lymphocyst. The patient will receive DVT and antibiotic prophylaxis as indicated. She voiced a clear understanding. She had the opportunity to ask questions. Perioperative instructions were reviewed with her. Prescriptions for post-op medications were sent to her pharmacy of choice.  Plan to repeat a CA125 prior to surgery.  28 minutes of total time was spent for this patient encounter, including preparation, face-to-face counseling with the patient and coordination of care, and documentation of the encounter.  Jeral Pinch, MD  Division of Gynecologic Oncology  Department of Obstetrics and Gynecology  [facility]

## 2021-12-13 NOTE — Patient Instructions (Signed)
Preparing for your Surgery  Plan for surgery on December 29, 2021 with Dr. Jeral Pinch at [place] will be scheduled for robotic assisted bilateral salpingo-oophorectomy (removal of both ovaries and fallopian tubes), robotic assisted total laparoscopic hysterectomy (removal of the uterus and cervix), possible staging if a cancer is identified, possible laparotomy (larger incision on your abdomen if needed).   Pre-operative Testing -You will receive a phone call from presurgical testing at [facility] to arrange for a pre-operative appointment and lab work.  -Bring your insurance card, copy of an advanced directive if applicable, medication list  -At that visit, you will be asked to sign a consent for a possible blood transfusion in case a transfusion becomes necessary during surgery.  The need for a blood transfusion is rare but having consent is a necessary part of your care.     -You should not be taking blood thinners or aspirin at least ten days prior to surgery unless instructed by your surgeon.  -Do not take supplements such as fish oil (omega 3), red yeast rice, turmeric before your surgery. You want to avoid medications with aspirin in them including headache powders such as BC or Goody's), Excedrin migraine.  Day Before Surgery at [place] will be asked to take in a light diet the day before surgery. You will be advised you can have clear liquids up until 3 hours before your surgery.    Eat a light diet the day before surgery.  Examples including soups, broths, toast, yogurt, mashed potatoes.  AVOID GAS PRODUCING FOODS. Things to avoid include carbonated beverages (fizzy beverages, sodas), raw fruits and raw vegetables (uncooked), or beans.   If your bowels are filled with gas, your surgeon will have difficulty visualizing your pelvic organs which increases your surgical risks.  Your role in recovery Your role is to become active as soon as directed by  your doctor, while still giving yourself time to heal.  Rest when you feel tired. You will be asked to do the following in order to speed your recovery:  - Cough and breathe deeply. This helps to clear and expand your lungs and can prevent pneumonia after surgery.  - [place]. Do mild physical activity. Walking or moving your legs help your circulation and body functions return to normal. Do not try to get up or walk alone the first time after surgery.   -If you develop swelling on one leg or the other, pain in the back of your leg, redness/warmth in one of your legs, please call the office or go to the Emergency Room to have a doppler to rule out a blood clot. For shortness of breath, chest pain-seek care in the Emergency Room as soon as possible. - Actively manage your pain. Managing your pain lets you move in comfort. We will ask you to rate your pain on a scale of zero to 10. It is your responsibility to tell your doctor or nurse where and how much you hurt so your pain can be treated.  Special Considerations -If you are diabetic, you may be placed on insulin after surgery to have closer control over your blood sugars to promote healing and recovery.  This does not mean that you will be discharged on insulin.  If applicable, your oral antidiabetics will be resumed when you are tolerating a solid diet.  -Your final pathology results from surgery should be available around one week after surgery and the results  will be relayed to you when available.  -FMLA forms can be faxed to [phone] and please allow 5-7 business days for completion.  Pain Management After Surgery -We will reach out to your provider who prescribes your hydrocodone/apap for pain medication recommendations around the time of surgery.  -Make sure that you have Tylenol and Ibuprofen IF YOU ARE ABLE TO TAKE THESE MEDICATIONS at home to use on a regular basis after surgery for pain control. We recommend  alternating the medications every hour to six hours since they work differently and are processed in the body differently for pain relief.  -Review the attached handout on narcotic use and their risks and side effects.   Bowel Regimen -You will be prescribed Sennakot-S to take nightly to prevent constipation especially if you are taking the narcotic pain medication intermittently.  It is important to prevent constipation and drink adequate amounts of liquids. You can stop taking this medication when you are not taking pain medication and you are back on your normal bowel routine.  Risks of Surgery Risks of surgery are low but include bleeding, infection, damage to surrounding structures, re-operation, blood clots, and very rarely death.   Blood Transfusion Information (For the consent to be signed before surgery)  We will be checking your blood type before surgery so in case of emergencies, we will know what type of blood you would need.                                            WHAT IS A BLOOD TRANSFUSION?  A transfusion is the replacement of blood or some of its parts. Blood is made up of multiple cells which provide different functions. Red blood cells carry oxygen and are used for blood loss replacement. White blood cells fight against infection. Platelets control bleeding. Plasma helps clot blood. Other blood products are available for specialized needs, such as hemophilia or other clotting disorders. BEFORE THE TRANSFUSION  Who gives blood for transfusions?  You may be able to donate blood to be used at a later date on yourself (autologous donation). Relatives can be asked to donate blood. This is generally not any safer than if you have received blood from a stranger. The same precautions are taken to ensure safety when a relative's blood is donated. Healthy volunteers who are fully evaluated to make sure their blood is safe. This is blood bank blood. Transfusion therapy is the  safest it has ever been in the practice of medicine. Before blood is taken from a donor, a complete history is taken to make sure that person has no history of diseases nor engages in risky social behavior (examples are intravenous drug use or sexual activity with multiple partners). The donor's travel history is screened to minimize risk of transmitting infections, such as malaria. The donated blood is tested for signs of infectious diseases, such as HIV and hepatitis. The blood is then tested to be sure it is compatible with you in order to minimize the chance of a transfusion reaction. If you or a relative donates blood, this is often done in anticipation of surgery and is not appropriate for emergency situations. It takes many days to process the donated blood. RISKS AND COMPLICATIONS Although transfusion therapy is very safe and saves many lives, the main dangers of transfusion include:  Getting an infectious disease. Developing a transfusion reaction. This  is an allergic reaction to something in the blood you were given. Every precaution is taken to prevent this. The decision to have a blood transfusion has been considered carefully by your caregiver before blood is given. Blood is not given unless the benefits outweigh the risks.  AFTER SURGERY INSTRUCTIONS  Return to work: 4-6 weeks if applicable  Activity: 1. Be up and out of the bed during the day.  Take a nap if needed.  You may walk up steps but be careful and use the hand rail.  Stair climbing will tire you more than you think, you may need to stop part way and rest.   2. No lifting or straining for 6 weeks over 10 pounds. No pushing, pulling, straining for 6 weeks.  3. No driving for around 1 week(s).  Do not drive if you are taking narcotic pain medicine and make sure that your reaction time has returned.   4. You can shower as soon as the next day after surgery. Shower daily.  Use your regular soap and water (not directly on the  incision) and pat your incision(s) dry afterwards; don't rub.  No tub baths or submerging your body in water until cleared by your surgeon. If you have the soap that was given to you by pre-surgical testing that was used before surgery, you do not need to use it afterwards because this can irritate your incisions.   5. No sexual activity and nothing in the vagina for 8-10 weeks if you had a hysterectomy (removal of the uterus and cervix).  6. You may experience a small amount of clear drainage from your incisions, which is normal.  If the drainage persists, increases, or changes color please call the office.  7. Do not use creams, lotions, or ointments such as neosporin on your incisions after surgery until advised by your surgeon because they can cause removal of the dermabond glue on your incisions.    8. You may experience vaginal spotting after surgery or around the 6-8 week mark from surgery when the stitches at the top of the vagina begin to dissolve.  The spotting is normal but if you experience heavy bleeding, call our office.  9. Take Tylenol or ibuprofen first for pain if you are able to take these medications and only use narcotic pain medication for severe pain not relieved by the Tylenol or Ibuprofen.  Monitor your Tylenol intake to a max of 4,000 mg in a 24 hour period. You can alternate these medications after surgery.  Diet: 1. Low sodium Heart Healthy Diet is recommended but you are cleared to resume your normal (before surgery) diet after your procedure.  2. It is safe to use a laxative, such as Miralax or Colace, if you have difficulty moving your bowels. You have been prescribed Sennakot-S to take at bedtime every evening after surgery to keep bowel movements regular and to prevent constipation.    Wound Care: 1. Keep clean and dry.  Shower daily.  Reasons to call the Doctor: Fever - Oral temperature greater than 100.4 degrees Fahrenheit Foul-smelling vaginal  discharge Difficulty urinating Nausea and vomiting Increased pain at the site of the incision that is unrelieved with pain medicine. Difficulty breathing with or without chest pain New calf pain especially if only on one side Sudden, continuing increased vaginal bleeding with or without clots.   Contacts: For questions or concerns you should contact:  Dr. Jeral Pinch at [phone]  Joylene John, NP at [phone]  After  Hours: call [phone] and have the GYN Oncologist paged/contacted (after 5 pm or on the weekends).  Messages sent via mychart are for non-urgent matters and are not responded to after hours so for urgent needs, please call the after hours number.

## 2021-12-13 NOTE — Telephone Encounter (Signed)
Spoke with [place] at [facility] regarding need for pain management recommendation for surgery scheduled 12/29/21. Per their request, faxed medical clearance form.

## 2021-12-15 NOTE — Patient Instructions (Signed)
DUE TO COVID-19 ONLY TWO VISITORS  (aged 64 and older)  ARE ALLOWED TO COME WITH YOU AND STAY IN THE WAITING ROOM ONLY DURING PRE OP AND PROCEDURE.   **NO VISITORS ARE ALLOWED IN THE SHORT STAY AREA OR RECOVERY ROOM!!**  IF YOU WILL BE ADMITTED INTO THE HOSPITAL YOU ARE ALLOWED ONLY FOUR SUPPORT PEOPLE DURING VISITATION HOURS ONLY (7 AM -8PM)   The support person(s) must pass our screening, gel in and out, and wear a mask at all times, including in the patient's room. Patients must also wear a mask when staff or their support person are in the room. Visitors GUEST BADGE MUST BE WORN VISIBLY  One adult visitor may remain with you overnight and MUST be in the room by 8 P.M.     Your procedure is scheduled on: 12/29/21   Report to [facility] Main Entrance    Report to admitting at : 5:15 AM   Call this number if you have problems the morning of surgery [phone]  Eat a light diet the day before surgery.  Examples including soups, broths, toast, yogurt, mashed potatoes.  Things to avoid include carbonated beverages (fizzy beverages), raw fruits and raw vegetables, or beans.   If your bowels are filled with gas, your surgeon will have difficulty visualizing your pelvic organs which increases your surgical risks.    Do not eat food :After Midnight.   After Midnight you may have the following liquids until : 4:30 AM DAY OF SURGERY  Water Black Coffee (sugar ok, NO MILK/CREAM OR CREAMERS)  Tea (sugar ok, NO MILK/CREAM OR CREAMERS) regular and decaf                             Plain Jell-O (NO RED)                                           Fruit ices (not with fruit pulp, NO RED)                                     Popsicles (NO RED)                                                                  Juice: apple, WHITE grape, WHITE cranberry Sports drinks like Gatorade (NO RED)               Oral Hygiene is also important to reduce your risk of infection.                                     Remember - BRUSH YOUR TEETH THE MORNING OF SURGERY WITH YOUR REGULAR TOOTHPASTE   Do NOT smoke after Midnight   Take these medicines the morning of surgery with A SIP OF WATER: pregabalin,citalopram,atenolol 25 mg. (Only),levothyroxine.Flonase as usual.  DO NOT TAKE ANY ORAL DIABETIC MEDICATIONS DAY OF YOUR SURGERY  You may not have any metal on your body including hair pins, jewelry, and body piercing             Do not wear make-up, lotions, powders, perfumes/cologne, or deodorant  Do not wear nail polish including gel and S&S, artificial/acrylic nails, or any other type of covering on natural nails including finger and toenails. If you have artificial nails, gel coating, etc. that needs to be removed by a nail salon please have this removed prior to surgery or surgery may need to be canceled/ delayed if the surgeon/ anesthesia feels like they are unable to be safely monitored.   Do not shave  48 hours prior to surgery.    Do not bring valuables to the hospital. [place].   Contacts, dentures or bridgework may not be worn into surgery.   Bring small overnight bag day of surgery.   DO NOT [place]. PHARMACY WILL DISPENSE MEDICATIONS LISTED ON YOUR MEDICATION LIST TO YOU DURING YOUR ADMISSION [place]!    Patients discharged on the day of surgery will not be allowed to drive home.  Someone NEEDS to stay with you for the first 24 hours after anesthesia.   Special Instructions: Bring a copy of your healthcare power of attorney and living will documents         the day of surgery if you haven't scanned them before.              Please read over the following fact sheets you were given: IF YOU HAVE QUESTIONS ABOUT YOUR PRE-OP INSTRUCTIONS PLEASE CALL [phone]     [facility] - Preparing for Surgery Before surgery, you can play an important role.  Because  skin is not sterile, your skin needs to be as free of germs as possible.  You can reduce the number of germs on your skin by washing with CHG (chlorahexidine gluconate) soap before surgery.  CHG is an antiseptic cleaner which kills germs and bonds with the skin to continue killing germs even after washing. Please DO NOT use if you have an allergy to CHG or antibacterial soaps.  If your skin becomes reddened/irritated stop using the CHG and inform your nurse when you arrive at Short Stay. Do not shave (including legs and underarms) for at least 48 hours prior to the first CHG shower.  You may shave your face/neck. Please follow these instructions carefully:  1.  Shower with CHG Soap the night before surgery and the  morning of Surgery.  2.  If you choose to wash your hair, wash your hair first as usual with your  normal  shampoo.  3.  After you shampoo, rinse your hair and body thoroughly to remove the  shampoo.                           4.  Use CHG as you would any other liquid soap.  You can apply chg directly  to the skin and wash                       Gently with a scrungie or clean washcloth.  5.  Apply the CHG Soap to your body ONLY FROM THE NECK DOWN.   Do not use on face/ open  Wound or open sores. Avoid contact with eyes, ears mouth and genitals (private parts).                       Wash face,  Genitals (private parts) with your normal soap.             6.  Wash thoroughly, paying special attention to the area where your surgery  will be performed.  7.  Thoroughly rinse your body with warm water from the neck down.  8.  DO NOT shower/wash with your normal soap after using and rinsing off  the CHG Soap.                9.  Pat yourself dry with a clean towel.            10.  Wear clean pajamas.            11.  Place clean sheets on your bed the night of your first shower and do not  sleep with pets. Day of Surgery : Do not apply any lotions/deodorants the morning of  surgery.  Please wear clean clothes to the hospital/surgery center.  FAILURE TO FOLLOW THESE INSTRUCTIONS MAY RESULT IN THE CANCELLATION OF YOUR SURGERY PATIENT SIGNATURE_________________________________  NURSE SIGNATURE__________________________________  ________________________________________________________________________  WHAT IS A BLOOD TRANSFUSION? Blood Transfusion Information  A transfusion is the replacement of blood or some of its parts. Blood is made up of multiple cells which provide different functions. Red blood cells carry oxygen and are used for blood loss replacement. White blood cells fight against infection. Platelets control bleeding. Plasma helps clot blood. Other blood products are available for specialized needs, such as hemophilia or other clotting disorders. BEFORE THE TRANSFUSION  Who gives blood for transfusions?  Healthy volunteers who are fully evaluated to make sure their blood is safe. This is blood bank blood. Transfusion therapy is the safest it has ever been in the practice of medicine. Before blood is taken from a donor, a complete history is taken to make sure that person has no history of diseases nor engages in risky social behavior (examples are intravenous drug use or sexual activity with multiple partners). The donor's travel history is screened to minimize risk of transmitting infections, such as malaria. The donated blood is tested for signs of infectious diseases, such as HIV and hepatitis. The blood is then tested to be sure it is compatible with you in order to minimize the chance of a transfusion reaction. If you or a relative donates blood, this is often done in anticipation of surgery and is not appropriate for emergency situations. It takes many days to process the donated blood. RISKS AND COMPLICATIONS Although transfusion therapy is very safe and saves many lives, the main dangers of transfusion include:  Getting an infectious  disease. Developing a transfusion reaction. This is an allergic reaction to something in the blood you were given. Every precaution is taken to prevent this. The decision to have a blood transfusion has been considered carefully by your caregiver before blood is given. Blood is not given unless the benefits outweigh the risks. AFTER THE TRANSFUSION Right after receiving a blood transfusion, you will usually feel much better and more energetic. This is especially true if your red blood cells have gotten low (anemic). The transfusion raises the level of the red blood cells which carry oxygen, and this usually causes an energy increase. The nurse administering the transfusion will monitor you carefully for  complications. HOME CARE INSTRUCTIONS  No special instructions are needed after a transfusion. You may find your energy is better. Speak with your caregiver about any limitations on activity for underlying diseases you may have. SEEK MEDICAL CARE IF:  Your condition is not improving after your transfusion. You develop redness or irritation at the intravenous (IV) site. SEEK IMMEDIATE MEDICAL CARE IF:  Any of the following symptoms occur over the next 12 hours: Shaking chills. You have a temperature by mouth above 102 F (38.9 C), not controlled by medicine. Chest, back, or muscle pain. People around you feel you are not acting correctly or are confused. Shortness of breath or difficulty breathing. Dizziness and fainting. You get a rash or develop hives. You have a decrease in urine output. Your urine turns a dark color or changes to pink, red, or brown. Any of the following symptoms occur over the next 10 days: You have a temperature by mouth above 102 F (38.9 C), not controlled by medicine. Shortness of breath. Weakness after normal activity. The white part of the eye turns yellow (jaundice). You have a decrease in the amount of urine or are urinating less often. Your urine turns a  dark color or changes to pink, red, or brown. Document Released: 02/25/2000 Document Revised: 05/22/2011 Document Reviewed: 10/14/2007 [facility] Patient Information 2014 [place], [place].  _______________________________________________________________________

## 2021-12-15 NOTE — Progress Notes (Signed)
Patient here with her husband for follow up with Dr. Jeral Pinch and for a pre-operative discussion prior to her scheduled surgery on December 29, 2021. She is scheduled for robotic assisted bilateral salpingo-oophorectomy, robotic assisted total laparoscopic hysterectomy, possible staging if a cancer is identified, possible laparotomy.  See after visit summary for additional details. Visual aids used to discuss items related to surgery including sequential compression stockings, foley catheter, IV pump, multi-modal pain regimen including tylenol, photo of the surgical robot, female reproductive system to discuss surgery in detail.      Discussed post-op pain management in detail including the aspects of the enhanced recovery pathway. She is on chronic hydrocodone/APAP. We will reach out to her provider managing this for recommendations for pain meds around surgery. We discussed the use of tylenol post-op and to monitor for a maximum of 4,000 mg in a 24 hour period.  Also prescribed sennakot to be used after surgery and to hold if having loose stools.  Discussed bowel regimen in detail.     Discussed SCDs and measures to take at home to prevent DVT including frequent mobility.  Reportable signs and symptoms of DVT discussed. Post-operative instructions discussed and expectations for after surgery. Incisional care discussed as well including reportable signs and symptoms including erythema, drainage, wound separation.     5 minutes spent with the patient.  Verbalizing understanding of material discussed. No needs or concerns voiced at the end of the visit.   Advised patient and family to call for any needs.  Advised that her post-operative medication had been prescribed and could be picked up at any time.    This appointment is included in the global surgical bundle as pre-operative teaching and has no charge.

## 2021-12-16 ENCOUNTER — Other Ambulatory Visit: Payer: Self-pay | Admitting: Gynecologic Oncology

## 2021-12-16 ENCOUNTER — Other Ambulatory Visit: Payer: Self-pay

## 2021-12-16 ENCOUNTER — Encounter (HOSPITAL_COMMUNITY)
Admission: RE | Admit: 2021-12-16 | Discharge: 2021-12-16 | Disposition: A | Discharge status: Home / Self Care | Payer: No Typology Code available for payment source | Source: Ambulatory Visit | Attending: Gynecologic Oncology | Admitting: Gynecologic Oncology

## 2021-12-16 ENCOUNTER — Encounter (HOSPITAL_COMMUNITY): Payer: Self-pay | Admitting: *Deleted

## 2021-12-16 ENCOUNTER — Telehealth: Payer: Self-pay

## 2021-12-16 VITALS — BP 128/67 | HR 62 | Temp 97.7°F | Ht 66.0 in | Wt 173.0 lb

## 2021-12-16 DIAGNOSIS — Z01818 Encounter for other preprocedural examination: Secondary | ICD-10-CM | POA: Diagnosis present

## 2021-12-16 DIAGNOSIS — R1900 Intra-abdominal and pelvic swelling, mass and lump, unspecified site: Secondary | ICD-10-CM | POA: Diagnosis not present

## 2021-12-16 DIAGNOSIS — I10 Essential (primary) hypertension: Secondary | ICD-10-CM | POA: Diagnosis not present

## 2021-12-16 DIAGNOSIS — G8918 Other acute postprocedural pain: Secondary | ICD-10-CM

## 2021-12-16 DIAGNOSIS — G8929 Other chronic pain: Secondary | ICD-10-CM

## 2021-12-16 HISTORY — DX: Hypothyroidism, unspecified: E03.9

## 2021-12-16 HISTORY — DX: Headache, unspecified: R51.9

## 2021-12-16 HISTORY — DX: Anemia, unspecified: D64.9

## 2021-12-16 LAB — TYPE AND SCREEN
ABO/RH(D): O NEG
Antibody Screen: NEGATIVE

## 2021-12-16 LAB — COMPREHENSIVE METABOLIC PANEL
ALT: 31 U/L (ref 0–44)
AST: 30 U/L (ref 15–41)
Albumin: 4.3 g/dL (ref 3.5–5.0)
Alkaline Phosphatase: 82 U/L (ref 38–126)
Anion gap: 8 (ref 5–15)
BUN: 8 mg/dL (ref 8–23)
CO2: 34 mmol/L — ABNORMAL HIGH (ref 22–32)
Calcium: 9.8 mg/dL (ref 8.9–10.3)
Chloride: 95 mmol/L — ABNORMAL LOW (ref 98–111)
Creatinine, Ser: 0.81 mg/dL (ref 0.44–1.00)
GFR, Estimated: >60 mL/min (ref >60)
Glucose, Bld: 86 mg/dL (ref 70–99)
Potassium: 3.7 mmol/L (ref 3.5–5.1)
Sodium: 137 mmol/L (ref 135–145)
Total Bilirubin: 0.9 mg/dL (ref 0.3–1.2)
Total Protein: 7.3 g/dL (ref 6.5–8.1)

## 2021-12-16 LAB — CBC
HCT: 42.5 % (ref 36.0–46.0)
Hemoglobin: 14.5 g/dL (ref 12.0–15.0)
MCH: 29.8 pg (ref 26.0–34.0)
MCHC: 34.1 g/dL (ref 30.0–36.0)
MCV: 87.3 fL (ref 80.0–100.0)
Platelets: 225 10*3/uL (ref 150–400)
RBC: 4.87 MIL/uL (ref 3.87–5.11)
RDW: 13.2 % (ref 11.5–15.5)
WBC: 4.6 10*3/uL (ref 4.0–10.5)
nRBC: 0 % (ref 0.0–0.2)

## 2021-12-16 MED ORDER — HYDROCODONE-ACETAMINOPHEN 10-325 MG PO TABS: 1.0000 | ORAL_TABLET | ORAL | 0 refills | Status: DC | PRN

## 2021-12-16 NOTE — Progress Notes (Signed)
For Short Stay: [place] appointment date: Date of COVID positive in last 39 days:  Bowel Prep reminder:   For Anesthesia: PCP - Carolee Rota: NP Cardiologist -   Chest x-ray -  EKG -  Stress Test -  ECHO -  Cardiac Cath -  Pacemaker/ICD device last checked: Pacemaker orders received: Device Rep notified:  Spinal Cord Stimulator:  Sleep Study -  CPAP -   Fasting Blood Sugar -  Checks Blood Sugar _____ times a day Date and result of last Hgb A1c-  Blood Thinner Instructions: Aspirin Instructions: Last Dose:  Activity level: Can go up a flight of stairs and activities of daily living without stopping and without chest pain and/or shortness of breath   Able to exercise without chest pain and/or shortness of breath   Unable to go up a flight of stairs without chest pain and/or shortness of breath     Anesthesia review: Hx: HTN  Patient denies shortness of breath, fever, cough and chest pain at PAT appointment   Patient verbalized understanding of instructions that were given to them at the PAT appointment. Patient was also instructed that they will need to review over the PAT instructions again at home before surgery.

## 2021-12-16 NOTE — Telephone Encounter (Addendum)
Called and spoke with [name] at [facility] to follow-up on surgical clearance/pain management fax which was sent on 12/13/21. Spoke with Aida who will get message to [facility] for [employer] PA.  Dr. Berline Lopes requesting how to handle post-op pain. Did confirm that patient does have pain contract that was updated recently.  Provided clinic phone number for call back.  Aida from [place] called to relay message from [place].  Because patient disclosed pain contract to Dr. Berline Lopes and because [facility] contacted their office, Ruby Cola PA is OK with either of the following two options:  Increasing Norco 10-325 from 2 to 4 times daily for immediate post op period. Dr. Berline Lopes can prescribe a five day prescription of an additional pain medication.  Routed to providers.  Called patient to advise that Melissa NP had called in 10 additional [place] for break through pain only to be used for first 5 days post-op. Patient understands that she can increase her normal prescription from 2-4 daily for those 5 days.  Patient verbalized understanding.

## 2021-12-16 NOTE — Progress Notes (Signed)
See phone note about post-op pain med from [facility], RN.

## 2021-12-17 LAB — CA 125: Cancer Antigen (CA) 125: 12.1 U/mL (ref 0.0–38.1)

## 2021-12-19 ENCOUNTER — Telehealth: Payer: Self-pay

## 2021-12-19 NOTE — Telephone Encounter (Signed)
Pt is aware of CA125 being in normal range. She was thankful for the call

## 2021-12-28 ENCOUNTER — Telehealth: Payer: Self-pay

## 2021-12-28 NOTE — Anesthesia Preprocedure Evaluation (Signed)
Anesthesia Evaluation  Patient identified by MRN, date of birth, ID band Patient awake    Reviewed: Allergy & Precautions, H&P , NPO status , Patient's Chart, lab work & pertinent test results  Airway Mallampati: II  TM Distance: >3 FB Neck ROM: Full    Dental no notable dental hx. (+) Teeth Intact, Dental Advisory Given, Caps   Pulmonary neg pulmonary ROS, former smoker,    Pulmonary exam normal breath sounds clear to auscultation       Cardiovascular Exercise Tolerance: Good hypertension, Pt. on medications negative cardio ROS Normal cardiovascular exam Rhythm:Regular Rate:Normal     Neuro/Psych negative neurological ROS  negative psych ROS   GI/Hepatic negative GI ROS, Neg liver ROS,   Endo/Other  negative endocrine ROSHypothyroidism   Renal/GU negative Renal ROS  negative genitourinary   Musculoskeletal negative musculoskeletal ROS (+) Fibromyalgia -, narcotic dependent  Abdominal   Peds negative pediatric ROS (+)  Hematology negative hematology ROS (+) Blood dyscrasia, anemia ,   Anesthesia Other Findings   Reproductive/Obstetrics negative OB ROS                            Anesthesia Physical Anesthesia Plan  ASA: 3  Anesthesia Plan: General   Post-op Pain Management: Tylenol PO (pre-op)*, Celebrex PO (pre-op)* and Ketamine IV*   Induction: Intravenous  PONV Risk Score and Plan: 3 and Ondansetron, Dexamethasone, Treatment may vary due to age or medical condition and Midazolam  Airway Management Planned: Oral ETT  Additional Equipment: None  Intra-op Plan:   Post-operative Plan: Extubation in OR  Informed Consent: I have reviewed the patients History and Physical, chart, labs and discussed the procedure including the risks, benefits and alternatives for the proposed anesthesia with the patient or authorized representative who has indicated his/her understanding and  acceptance.       Plan Discussed with: Anesthesiologist and CRNA  Anesthesia Plan Comments: (  )        Anesthesia Quick Evaluation

## 2021-12-28 NOTE — Telephone Encounter (Signed)
Telephone call to check on pre-operative status.  Patient compliant with pre-operative instructions.  Reinforced nothing to eat after midnight. Clear liquids until 4:30am. Patient to arrive at 5:15am.  No questions or concerns voiced.  Instructed to call for any needs.  Reviewed medications to take morning of surgery and pt repeated list back to me.

## 2021-12-29 ENCOUNTER — Ambulatory Visit (HOSPITAL_COMMUNITY)
Admission: RE | Admit: 2021-12-29 | Discharge: 2021-12-29 | Disposition: A | Discharge status: Home / Self Care | Payer: PRIVATE HEALTH INSURANCE | Attending: Gynecologic Oncology | Admitting: Gynecologic Oncology

## 2021-12-29 ENCOUNTER — Other Ambulatory Visit: Payer: Self-pay

## 2021-12-29 ENCOUNTER — Encounter (HOSPITAL_COMMUNITY)
Admission: RE | Disposition: A | Discharge status: Home / Self Care | Payer: Self-pay | Source: Home / Self Care | Attending: Gynecologic Oncology

## 2021-12-29 ENCOUNTER — Ambulatory Visit (HOSPITAL_COMMUNITY): Payer: PRIVATE HEALTH INSURANCE | Admitting: Anesthesiology

## 2021-12-29 ENCOUNTER — Encounter (HOSPITAL_COMMUNITY): Payer: Self-pay | Admitting: Gynecologic Oncology

## 2021-12-29 ENCOUNTER — Ambulatory Visit (HOSPITAL_BASED_OUTPATIENT_CLINIC_OR_DEPARTMENT_OTHER): Payer: PRIVATE HEALTH INSURANCE | Admitting: Anesthesiology

## 2021-12-29 DIAGNOSIS — I10 Essential (primary) hypertension: Secondary | ICD-10-CM | POA: Insufficient documentation

## 2021-12-29 DIAGNOSIS — N838 Other noninflammatory disorders of ovary, fallopian tube and broad ligament: Secondary | ICD-10-CM

## 2021-12-29 DIAGNOSIS — R1900 Intra-abdominal and pelvic swelling, mass and lump, unspecified site: Secondary | ICD-10-CM

## 2021-12-29 DIAGNOSIS — D630 Anemia in neoplastic disease: Secondary | ICD-10-CM | POA: Diagnosis not present

## 2021-12-29 DIAGNOSIS — D271 Benign neoplasm of left ovary: Secondary | ICD-10-CM | POA: Insufficient documentation

## 2021-12-29 DIAGNOSIS — E039 Hypothyroidism, unspecified: Secondary | ICD-10-CM

## 2021-12-29 DIAGNOSIS — Z87891 Personal history of nicotine dependence: Secondary | ICD-10-CM | POA: Insufficient documentation

## 2021-12-29 DIAGNOSIS — Z79891 Long term (current) use of opiate analgesic: Secondary | ICD-10-CM | POA: Diagnosis not present

## 2021-12-29 DIAGNOSIS — Z8742 Personal history of other diseases of the female genital tract: Secondary | ICD-10-CM

## 2021-12-29 DIAGNOSIS — M797 Fibromyalgia: Secondary | ICD-10-CM | POA: Insufficient documentation

## 2021-12-29 DIAGNOSIS — D270 Benign neoplasm of right ovary: Secondary | ICD-10-CM

## 2021-12-29 DIAGNOSIS — D649 Anemia, unspecified: Secondary | ICD-10-CM | POA: Insufficient documentation

## 2021-12-29 DIAGNOSIS — N72 Inflammatory disease of cervix uteri: Secondary | ICD-10-CM | POA: Insufficient documentation

## 2021-12-29 DIAGNOSIS — D638 Anemia in other chronic diseases classified elsewhere: Secondary | ICD-10-CM

## 2021-12-29 DIAGNOSIS — D759 Disease of blood and blood-forming organs, unspecified: Secondary | ICD-10-CM | POA: Insufficient documentation

## 2021-12-29 DIAGNOSIS — D279 Benign neoplasm of unspecified ovary: Secondary | ICD-10-CM | POA: Diagnosis not present

## 2021-12-29 DIAGNOSIS — D3911 Neoplasm of uncertain behavior of right ovary: Secondary | ICD-10-CM | POA: Diagnosis not present

## 2021-12-29 HISTORY — PX: ROBOTIC ASSISTED TOTAL HYSTERECTOMY: SHX6085

## 2021-12-29 HISTORY — PX: ROBOTIC ASSISTED BILATERAL SALPINGO OOPHERECTOMY: SHX6078

## 2021-12-29 LAB — ABO/RH: ABO/RH(D): O NEG

## 2021-12-29 SURGERY — SALPINGO-OOPHORECTOMY, BILATERAL, ROBOT-ASSISTED
Anesthesia: General

## 2021-12-29 MED ORDER — KETAMINE HCL 50 MG/ML IJ SOLN
INTRAMUSCULAR | Status: DC | PRN
  Administered 2021-12-29: 30 mg via INTRAMUSCULAR

## 2021-12-29 MED ORDER — SUGAMMADEX SODIUM 200 MG/2ML IV SOLN
INTRAVENOUS | Status: DC | PRN
  Administered 2021-12-29: 200 mg via INTRAVENOUS

## 2021-12-29 MED ORDER — LACTATED RINGERS IR SOLN
Status: DC | PRN
  Administered 2021-12-29: 1000 mL

## 2021-12-29 MED ORDER — DEXAMETHASONE SODIUM PHOSPHATE 10 MG/ML IJ SOLN
INTRAMUSCULAR | Status: DC | PRN
  Administered 2021-12-29: 10 mg via INTRAVENOUS

## 2021-12-29 MED ORDER — ACETAMINOPHEN 500 MG PO TABS
1000.0000 mg | ORAL_TABLET | ORAL | Status: AC
  Administered 2021-12-29: 1000 mg via ORAL
  Filled 2021-12-29: qty 2

## 2021-12-29 MED ORDER — ACETAMINOPHEN 160 MG/5ML PO SOLN: 325.0000 mg | ORAL | Status: DC | PRN

## 2021-12-29 MED ORDER — LIDOCAINE 2% (20 MG/ML) 5 ML SYRINGE
INTRAMUSCULAR | Status: DC | PRN
  Administered 2021-12-29: 80 mg via INTRAVENOUS
  Administered 2021-12-29: 1.5 mg/kg/h via INTRAVENOUS

## 2021-12-29 MED ORDER — MIDAZOLAM HCL 5 MG/5ML IJ SOLN
INTRAMUSCULAR | Status: DC | PRN
  Administered 2021-12-29: 2 mg via INTRAVENOUS

## 2021-12-29 MED ORDER — STERILE WATER FOR IRRIGATION IR SOLN
Status: DC | PRN
  Administered 2021-12-29: 1000 mL

## 2021-12-29 MED ORDER — ROCURONIUM BROMIDE 10 MG/ML (PF) SYRINGE
PREFILLED_SYRINGE | INTRAVENOUS | Status: DC | PRN
  Administered 2021-12-29 (×2): 10 mg via INTRAVENOUS
  Administered 2021-12-29: 70 mg via INTRAVENOUS

## 2021-12-29 MED ORDER — ONDANSETRON HCL 4 MG/2ML IJ SOLN
INTRAMUSCULAR | Status: DC | PRN
  Administered 2021-12-29: 4 mg via INTRAVENOUS

## 2021-12-29 MED ORDER — OXYCODONE HCL 5 MG/5ML PO SOLN: 5.0000 mg | Freq: Once | ORAL | Status: DC | PRN

## 2021-12-29 MED ORDER — DEXAMETHASONE SODIUM PHOSPHATE 4 MG/ML IJ SOLN: 4.0000 mg | INTRAMUSCULAR | Status: DC

## 2021-12-29 MED ORDER — FENTANYL CITRATE PF 50 MCG/ML IJ SOSY
25.0000 ug | PREFILLED_SYRINGE | INTRAMUSCULAR | Status: DC | PRN
  Administered 2021-12-29 (×2): 50 ug via INTRAVENOUS

## 2021-12-29 MED ORDER — KETAMINE HCL 10 MG/ML IJ SOLN
INTRAMUSCULAR | Status: AC
  Filled 2021-12-29: qty 1

## 2021-12-29 MED ORDER — OXYCODONE HCL 5 MG PO TABS
ORAL_TABLET | ORAL | Status: AC
  Administered 2021-12-29: 5 mg
  Filled 2021-12-29: qty 1

## 2021-12-29 MED ORDER — PROPOFOL 10 MG/ML IV BOLUS
INTRAVENOUS | Status: AC
  Filled 2021-12-29: qty 20

## 2021-12-29 MED ORDER — ONDANSETRON HCL 4 MG/2ML IJ SOLN: 4.0000 mg | Freq: Once | INTRAMUSCULAR | Status: DC | PRN

## 2021-12-29 MED ORDER — CEFAZOLIN SODIUM-DEXTROSE 2-4 GM/100ML-% IV SOLN
2.0000 g | INTRAVENOUS | Status: AC
  Administered 2021-12-29: 2 g via INTRAVENOUS
  Filled 2021-12-29: qty 100

## 2021-12-29 MED ORDER — BUPIVACAINE HCL 0.25 % IJ SOLN
INTRAMUSCULAR | Status: AC
  Filled 2021-12-29: qty 1

## 2021-12-29 MED ORDER — HEPARIN SODIUM (PORCINE) 5000 UNIT/ML IJ SOLN
5000.0000 [IU] | INTRAMUSCULAR | Status: AC
  Administered 2021-12-29: 5000 [IU] via SUBCUTANEOUS
  Filled 2021-12-29: qty 1

## 2021-12-29 MED ORDER — ACETAMINOPHEN 325 MG PO TABS: 325.0000 mg | ORAL_TABLET | ORAL | Status: DC | PRN

## 2021-12-29 MED ORDER — OXYCODONE HCL 5 MG PO TABS: 5.0000 mg | ORAL_TABLET | Freq: Once | ORAL | Status: DC | PRN

## 2021-12-29 MED ORDER — FENTANYL CITRATE PF 50 MCG/ML IJ SOSY
PREFILLED_SYRINGE | INTRAMUSCULAR | Status: AC
  Filled 2021-12-29: qty 1

## 2021-12-29 MED ORDER — PROPOFOL 10 MG/ML IV BOLUS
INTRAVENOUS | Status: DC | PRN
  Administered 2021-12-29: 150 mg via INTRAVENOUS

## 2021-12-29 MED ORDER — SCOPOLAMINE 1 MG/3DAYS TD PT72
1.0000 | MEDICATED_PATCH | TRANSDERMAL | Status: DC
  Administered 2021-12-29: 1.5 mg via TRANSDERMAL
  Filled 2021-12-29: qty 1

## 2021-12-29 MED ORDER — ORAL CARE MOUTH RINSE: 15.0000 mL | Freq: Once | OROMUCOSAL | Status: AC

## 2021-12-29 MED ORDER — ONDANSETRON HCL 4 MG/2ML IJ SOLN
INTRAMUSCULAR | Status: AC
  Filled 2021-12-29: qty 2

## 2021-12-29 MED ORDER — EPHEDRINE SULFATE-NACL 50-0.9 MG/10ML-% IV SOSY
PREFILLED_SYRINGE | INTRAVENOUS | Status: DC | PRN
  Administered 2021-12-29 (×4): 10 mg via INTRAVENOUS

## 2021-12-29 MED ORDER — LACTATED RINGERS IV SOLN: INTRAVENOUS | Status: DC

## 2021-12-29 MED ORDER — FENTANYL CITRATE (PF) 250 MCG/5ML IJ SOLN
INTRAMUSCULAR | Status: AC
  Filled 2021-12-29: qty 5

## 2021-12-29 MED ORDER — MIDAZOLAM HCL 2 MG/2ML IJ SOLN
INTRAMUSCULAR | Status: AC
  Filled 2021-12-29: qty 2

## 2021-12-29 MED ORDER — CHLORHEXIDINE GLUCONATE 0.12 % MT SOLN
15.0000 mL | Freq: Once | OROMUCOSAL | Status: AC
  Administered 2021-12-29: 15 mL via OROMUCOSAL

## 2021-12-29 MED ORDER — CELECOXIB 200 MG PO CAPS
200.0000 mg | ORAL_CAPSULE | ORAL | Status: AC
  Administered 2021-12-29: 200 mg via ORAL
  Filled 2021-12-29: qty 1

## 2021-12-29 MED ORDER — MEPERIDINE HCL 50 MG/ML IJ SOLN: 6.2500 mg | INTRAMUSCULAR | Status: DC | PRN

## 2021-12-29 MED ORDER — FENTANYL CITRATE (PF) 100 MCG/2ML IJ SOLN
INTRAMUSCULAR | Status: DC | PRN
  Administered 2021-12-29 (×3): 50 ug via INTRAVENOUS

## 2021-12-29 MED ORDER — BUPIVACAINE HCL 0.25 % IJ SOLN
INTRAMUSCULAR | Status: DC | PRN
  Administered 2021-12-29: 31 mL

## 2021-12-29 SURGICAL SUPPLY — 69 items
APPLICATOR SURGIFLO ENDO (HEMOSTASIS) IMPLANT
BAG LAPAROSCOPIC 12 15 PORT 16 (BASKET) IMPLANT
BAG RETRIEVAL 12/15 (BASKET) ×2
BLADE SURG SZ10 CARB STEEL (BLADE) IMPLANT
COVER BACK TABLE 60X90IN (DRAPES) ×2 IMPLANT
COVER TIP SHEARS 8 DVNC (MISCELLANEOUS) ×2 IMPLANT
COVER TIP SHEARS 8MM DA VINCI (MISCELLANEOUS) ×2
DERMABOND ADVANCED .7 DNX12 (GAUZE/BANDAGES/DRESSINGS) ×2 IMPLANT
DRAPE ARM DVNC X/XI (DISPOSABLE) ×8 IMPLANT
DRAPE COLUMN DVNC XI (DISPOSABLE) ×2 IMPLANT
DRAPE DA VINCI XI ARM (DISPOSABLE) ×8
DRAPE DA VINCI XI COLUMN (DISPOSABLE) ×2
DRAPE SHEET LG 3/4 BI-LAMINATE (DRAPES) ×2 IMPLANT
DRAPE SURG IRRIG POUCH 19X23 (DRAPES) ×2 IMPLANT
DRSG OPSITE POSTOP 4X6 (GAUZE/BANDAGES/DRESSINGS) IMPLANT
DRSG OPSITE POSTOP 4X8 (GAUZE/BANDAGES/DRESSINGS) IMPLANT
ELECT PENCIL ROCKER SW 15FT (MISCELLANEOUS) IMPLANT
ELECT REM PT RETURN 15FT ADLT (MISCELLANEOUS) ×2 IMPLANT
GAUZE 4X4 16PLY ~~LOC~~+RFID DBL (SPONGE) ×4 IMPLANT
GLOVE BIO SURGEON STRL SZ 6 (GLOVE) ×8 IMPLANT
GLOVE BIO SURGEON STRL SZ 6.5 (GLOVE) ×2 IMPLANT
GOWN STRL REUS W/ TWL LRG LVL3 (GOWN DISPOSABLE) ×8 IMPLANT
GOWN STRL REUS W/TWL LRG LVL3 (GOWN DISPOSABLE) ×10
GRASPER SUT TROCAR 14GX15 (MISCELLANEOUS) IMPLANT
HOLDER FOLEY CATH W/STRAP (MISCELLANEOUS) IMPLANT
IRRIG SUCT STRYKERFLOW 2 WTIP (MISCELLANEOUS) ×2
IRRIGATION SUCT STRKRFLW 2 WTP (MISCELLANEOUS) ×2 IMPLANT
KIT TURNOVER KIT A (KITS) IMPLANT
LIGASURE IMPACT 36 18CM CVD LR (INSTRUMENTS) IMPLANT
MANIPULATOR ADVINCU DEL 3.0 PL (MISCELLANEOUS) IMPLANT
MANIPULATOR ADVINCU DEL 3.5 PL (MISCELLANEOUS) IMPLANT
MANIPULATOR UTERINE 4.5 ZUMI (MISCELLANEOUS) IMPLANT
NDL HYPO 21X1.5 SAFETY (NEEDLE) ×2 IMPLANT
NEEDLE HYPO 21X1.5 SAFETY (NEEDLE) ×2 IMPLANT
OBTURATOR OPTICAL STANDARD 8MM (TROCAR) ×2
OBTURATOR OPTICAL STND 8 DVNC (TROCAR) ×2
OBTURATOR OPTICALSTD 8 DVNC (TROCAR) ×2 IMPLANT
PACK ROBOT GYN CUSTOM WL (TRAY / TRAY PROCEDURE) ×2 IMPLANT
PAD POSITIONING PINK XL (MISCELLANEOUS) ×2 IMPLANT
PORT ACCESS TROCAR AIRSEAL 12 (TROCAR) ×2 IMPLANT
PORT ACCESS TROCAR AIRSEAL 5M (TROCAR) ×2
SEAL CANN UNIV 5-8 DVNC XI (MISCELLANEOUS) ×8 IMPLANT
SEAL XI 5MM-8MM UNIVERSAL (MISCELLANEOUS) ×8
SEALER VESSEL DA VINCI XI (MISCELLANEOUS) ×2
SEALER VESSEL EXT DVNC XI (MISCELLANEOUS) IMPLANT
SET TRI-LUMEN FLTR TB AIRSEAL (TUBING) ×2 IMPLANT
SOL PREP POV-IOD 4OZ 10% (MISCELLANEOUS) ×4 IMPLANT
SPIKE FLUID TRANSFER (MISCELLANEOUS) ×2 IMPLANT
SPONGE T-LAP 18X18 ~~LOC~~+RFID (SPONGE) IMPLANT
SURGIFLO W/THROMBIN 8M KIT (HEMOSTASIS) IMPLANT
SUT MNCRL AB 4-0 PS2 18 (SUTURE) IMPLANT
SUT PDS AB 1 TP1 96 (SUTURE) IMPLANT
SUT VIC AB 0 CT1 27 (SUTURE) ×2
SUT VIC AB 0 CT1 27XBRD ANTBC (SUTURE) IMPLANT
SUT VIC AB 2-0 CT1 27 (SUTURE)
SUT VIC AB 2-0 CT1 TAPERPNT 27 (SUTURE) IMPLANT
SUT VIC AB 4-0 PS2 18 (SUTURE) ×4 IMPLANT
SUT VLOC 180 0 9IN  GS21 (SUTURE)
SUT VLOC 180 0 9IN GS21 (SUTURE) IMPLANT
SYS BAG RETRIEVAL 10MM (BASKET)
SYS WOUND ALEXIS 18CM MED (MISCELLANEOUS)
SYSTEM BAG RETRIEVAL 10MM (BASKET) IMPLANT
SYSTEM WOUND ALEXIS 18CM MED (MISCELLANEOUS) IMPLANT
TOWEL OR NON WOVEN STRL DISP B (DISPOSABLE) IMPLANT
TRAP SPECIMEN MUCUS 40CC (MISCELLANEOUS) IMPLANT
TRAY FOLEY MTR SLVR 16FR STAT (SET/KITS/TRAYS/PACK) ×2 IMPLANT
UNDERPAD 30X36 HEAVY ABSORB (UNDERPADS AND DIAPERS) ×4 IMPLANT
WATER STERILE IRR 1000ML POUR (IV SOLUTION) ×2 IMPLANT
YANKAUER SUCT BULB TIP 10FT TU (MISCELLANEOUS) IMPLANT

## 2021-12-29 NOTE — Op Note (Signed)
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 On intra-abdominal entry, normal upper abdominal survey. Normal omentum, small and large bowel. Normal appearing left adnexa, ovary somewhat adherent to the broad ligament. Uterus 6 cm and deviated to the left and anteriorly because of large cystic and solid, multi-lobed mass replacing the right ovary, measuring approximately 10 cm. Solid portion of the mass with appearance of a fibrous mass on contained transvaginal morcellation in a bag. Cystic component with somewhat purulent appearing fluid. Right fallopian tube elongated but normal in appearance. Some peritoneal windows c/w history of endometriosis. No ascites. No intra-abdominal or pelvic evidence of metastatic disease. Given possible borderline tumor, staging procedures including peritoneal biopsies and omental biopsy performed.   Estimated Blood Loss:  75 cc      Total IV Fluids: see I&O flowsheet         Specimens: uterus, cervix, bilateral tubes and ovaries, pelvic washings, peritoneal biopsies, omental biopsy         Complications:  None apparent; patient tolerated the procedure well.         Disposition: PACU - hemodynamically stable.  Procedure Details  The patient was seen in the Holding Room. The risks, benefits, complications, treatment options, and expected outcomes were discussed with the patient.  The patient concurred  with the proposed plan, giving informed consent.  The site of surgery properly noted/marked. The patient was identified as Emma Hill and the procedure verified as a Robotic-assisted hysterectomy with bilateral salpingo oophorectomy, possible staging.   After induction of anesthesia, the patient was draped and prepped in the usual sterile manner. Patient was placed in supine position after anesthesia and draped and prepped in the usual sterile manner as follows: Her arms were tucked to her side with all appropriate precautions.  The patient was secured to the bed with padding and tape across her chest.  The patient was placed in the semi-lithotomy position in [place].  The perineum and vagina were prepped with Betadine. The abdomen was prepped with CholoraPrep and the patient was draped after the CholoraPrep had been allowed to dry for 3 minutes.  A Time Out was held and the above information confirmed.  The urethra was prepped with Betadine. Foley catheter was placed.  A sterile speculum was placed in the vagina.  The cervix was grasped with a single-tooth tenaculum. The cervix was dilated with Kennon Rounds dilators.  The ZUMI uterine manipulator with a medium colpotomizer ring was placed without difficulty.  A pneum occluder balloon was placed over the manipulator.  OG tube placement was confirmed and to suction.   Next, a 10 mm skin incision was made 1 cm below the subcostal margin in the midclavicular line.  The 5 mm Optiview port and scope was used for direct entry.  Opening pressure was under 10 mm CO2.  The abdomen was insufflated and the findings were noted as above.   At this point and all points during the procedure, the patient's intra-abdominal pressure did not exceed 15 mmHg. Next, an 8 mm skin incision was made  superior to the umbilicus and a right and left port were placed about 8 cm lateral to the robot port on the right and left side.  A fourth arm was placed on the right.  The 5 mm assist  trocar was exchanged for a 10-12 mm port. All ports were placed under direct visualization.  The patient was placed in steep Trendelenburg.  The robot was docked in the normal manner.  Pelvic washings were collected.  The right peritoneum was opened parallel to the IP ligament to open the retroperitoneal space. The round ligament was transected. The ureter was noted to be on the medial leaf of the broad ligament.  The peritoneum above the ureter was incised and stretched and the infundibulopelvic ligament was skeletonized, cauterized and cut. The fallopian tube and utero-ovarian ligament were cauterized and transected. The right adnexa was elevated and a combination of sharp dissection and short bursts of electrocautery were used to free the adnexa from attachment to the broad ligament. Once freed, the adnexa was placed in an Endocatch bag.   The left peritoneum was opened parallel to the IP ligament to open the retroperitoneal space. The round ligament was transected. The ureter was noted to be on the medial leaf of the broad ligament.  The peritoneum above the ureter was incised and stretched and the infundibulopelvic ligament was skeletonized, cauterized and cut.    The posterior peritoneum was taken down to the level of the KOH ring bilaterally.  The anterior peritoneum was also taken down.  The bladder flap was created to the level of the KOH ring.  Some adhesions were noted anteriorly c/w prior c-section. The uterine artery on the right side was skeletonized, cauterized and cut in the normal manner.  A similar procedure was performed on the left.  The colpotomy was made and the uterus, cervix, left tube and ovary were amputated and delivered through the vagina.  Pedicles were inspected and excellent hemostasis was achieved.    The endocatch bag was brought down to and through the vagina. Contained morcellation of the mass was then performed using Lahey tenaculums and a scalpel. Once the mass was  small enough to be delivered through the vagina, the remainder of the mass was delivered intact in the Endocatch bag and the entire specimen was sent for frozen.   The colpotomy at the vaginal cuff was closed with Vicryl on a CT1 needle in a running manner.  Irrigation was used and excellent hemostasis was achieved.    Once frozen section returned, given concern for possible borderline component of the tumor, staging procedures performed. Peritoneal biopsies were taking with sharp dissection and short bursts of electrocautery. One instrument was removed and the patient was flattened. A large infracolic omental biopsy was performed using the vessel sealer. This was placed in an Endocatch bag and removed through the assist trocar.   At this point in the procedure was completed.  Robotic instruments were removed under direct visulaization.  The robot was undocked. The fascia at the 10-12 mm port was closed with 0 Vicryl using a fascial closure device under direct visualization. The deep subcutaneous tissue of this incision was also closed with 0 Vicryl.  The subcuticular tissue was closed with 4-0 Vicryl and the skin was closed with 4-0 Monocryl in a subcuticular manner.  Dermabond was applied.    The vagina was swabbed with  minimal bleeding noted.  All sponge, lap and needle counts were correct x  3.   The patient was  transferred to the recovery room in stable condition.  Jeral Pinch, MD

## 2021-12-29 NOTE — Anesthesia Procedure Notes (Signed)
Procedure Name: Intubation Date/Time: 12/29/2021 8:03 AM  Performed by: Gean Maidens, CRNAPre-anesthesia Checklist: Patient identified, Emergency Drugs available, Suction available, Patient being monitored and Timeout performed Patient Re-evaluated:Patient Re-evaluated prior to induction Oxygen Delivery Method: Circle system utilized Preoxygenation: Pre-oxygenation with 100% oxygen Induction Type: IV induction Ventilation: Mask ventilation without difficulty Laryngoscope Size: Mac and 3 Grade View: Grade I Tube type: Oral Tube size: 7.0 mm Number of attempts: 1 Airway Equipment and Method: Stylet Placement Confirmation: ETT inserted through vocal cords under direct vision, positive ETCO2 and breath sounds checked- equal and bilateral Secured at: 21 cm Tube secured with: Tape Dental Injury: Teeth and Oropharynx as per pre-operative assessment

## 2021-12-29 NOTE — Anesthesia Postprocedure Evaluation (Signed)
Anesthesia Post Note  Patient: Emma Hill  Procedure(s) Performed: XI ROBOTIC ASSISTED BILATERAL SALPINGO OOPHORECTOMY (Bilateral) XI ROBOTIC ASSISTED TOTAL HYSTERECTOMY WITH STAGING     Patient location during evaluation: PACU Anesthesia Type: General Level of consciousness: awake and alert Pain management: pain level controlled Vital Signs Assessment: post-procedure vital signs reviewed and stable Respiratory status: spontaneous breathing, nonlabored ventilation, respiratory function stable and patient connected to nasal cannula oxygen Cardiovascular status: blood pressure returned to baseline and stable Postop Assessment: no apparent nausea or vomiting Anesthetic complications: no   No notable events documented.  Last Vitals:  Vitals:   12/29/21 1245 12/29/21 1324  BP: 111/74 129/72  Pulse: (!) 56 (!) 58  Resp: 17   Temp:    SpO2: 97% 99%    Last Pain:  Vitals:   12/29/21 1324  TempSrc:   PainSc: 0-No pain                 [name]

## 2021-12-29 NOTE — Discharge Instructions (Addendum)
AFTER SURGERY INSTRUCTIONS   Return to work: 4-6 weeks if applicable  You may experience vaginal soreness which would be expected since you had a hysterectomy and the ovarian mass was removed through the vagina as well.   Activity: 1. Be up and out of the bed during the day.  Take a nap if needed.  You may walk up steps but be careful and use the hand rail.  Stair climbing will tire you more than you think, you may need to stop part way and rest.    2. No lifting or straining for 6 weeks over 10 pounds. No pushing, pulling, straining for 6 weeks.   3. No driving for around 1 week(s).  Do not drive if you are taking narcotic pain medicine and make sure that your reaction time has returned.    4. You can shower as soon as the next day after surgery. Shower daily.  Use your regular soap and water (not directly on the incision) and pat your incision(s) dry afterwards; don't rub.  No tub baths or submerging your body in water until cleared by your surgeon. If you have the soap that was given to you by pre-surgical testing that was used before surgery, you do not need to use it afterwards because this can irritate your incisions.    5. No sexual activity and nothing in the vagina for 8-10 weeks since you had a hysterectomy (removal of the uterus and cervix).   6. You may experience a small amount of clear drainage from your incisions, which is normal.  If the drainage persists, increases, or changes color please call the office.   7. Do not use creams, lotions, or ointments such as neosporin on your incisions after surgery until advised by your surgeon because they can cause removal of the dermabond glue on your incisions.     8. You may experience vaginal spotting after surgery or around the 6-8 week mark from surgery when the stitches at the top of the vagina begin to dissolve.  The spotting is normal but if you experience heavy bleeding, call our office.   9. Take Tylenol or ibuprofen first for  pain if you are able to take these medications and only use narcotic pain medication for severe pain not relieved by the Tylenol or Ibuprofen.  Monitor your Tylenol intake to a max of 4,000 mg in a 24 hour period. You can alternate these medications after surgery.   Diet: 1. Low sodium Heart Healthy Diet is recommended but you are cleared to resume your normal (before surgery) diet after your procedure.   2. It is safe to use a laxative, such as Miralax or Colace, if you have difficulty moving your bowels. You have been prescribed Sennakot-S to take at bedtime every evening after surgery to keep bowel movements regular and to prevent constipation.     Wound Care: 1. Keep clean and dry.  Shower daily.   Reasons to call the Doctor: Fever - Oral temperature greater than 100.4 degrees Fahrenheit Foul-smelling vaginal discharge Difficulty urinating Nausea and vomiting Increased pain at the site of the incision that is unrelieved with pain medicine. Difficulty breathing with or without chest pain New calf pain especially if only on one side Sudden, continuing increased vaginal bleeding with or without clots.   Contacts: For questions or concerns you should contact:   Dr. Jeral Pinch at [phone]   Joylene John, NP at [phone]   After Hours: call [phone] and have the  GYN Oncologist paged/contacted (after 5 pm or on the weekends).   Messages sent via mychart are for non-urgent matters and are not responded to after hours so for urgent needs, please call the after hours number.

## 2021-12-29 NOTE — Interval H&P Note (Signed)
History and Physical Interval Note:  12/29/2021 7:04 AM  Emma Hill  has presented today for surgery, with the diagnosis of PELVIC MASSES.  The various methods of treatment have been discussed with the patient and family. After consideration of risks, benefits and other options for treatment, the patient has consented to  Procedure(s): XI ROBOTIC ASSISTED BILATERAL SALPINGO OOPHORECTOMY (Bilateral) XI ROBOTIC ASSISTED TOTAL HYSTERECTOMY (N/A) POSSIBLE LAPAROTOMY WITH STAGING (N/A) as a surgical intervention.  The patient's history has been reviewed, patient examined, no change in status, stable for surgery.  I have reviewed the patient's chart and labs.  Questions were answered to the patient's satisfaction.     Lafonda Mosses

## 2021-12-29 NOTE — Transfer of Care (Signed)
Immediate Anesthesia Transfer of Care Note  Patient: Emma Hill  Procedure(s) Performed: XI ROBOTIC ASSISTED BILATERAL SALPINGO OOPHORECTOMY (Bilateral) XI ROBOTIC ASSISTED TOTAL HYSTERECTOMY WITH STAGING  Patient Location: PACU  Anesthesia Type:General  Level of Consciousness: awake and patient cooperative  Airway & Oxygen Therapy: Patient Spontanous Breathing and Patient connected to face mask  Post-op Assessment: Report given to RN and Post -op Vital signs reviewed and stable  Post vital signs: Reviewed and stable  Last Vitals:  Vitals Value Taken Time  BP 135/70 12/29/21 1110  Temp 36.4 C 12/29/21 1110  Pulse 63 12/29/21 1111  Resp 11 12/29/21 1111  SpO2 100 % 12/29/21 1111  Vitals shown include unvalidated device data.  Last Pain:  Vitals:   12/29/21 0556  TempSrc:   PainSc: 0-No pain         Complications: No notable events documented.

## 2021-12-30 ENCOUNTER — Encounter (HOSPITAL_COMMUNITY): Payer: Self-pay | Admitting: Gynecologic Oncology

## 2021-12-30 ENCOUNTER — Telehealth: Payer: Self-pay | Admitting: Surgery

## 2021-12-30 NOTE — Telephone Encounter (Signed)
Spoke with Emma Hill this morning. She states she is eating, drinking and urinating well. She has not had a BM yet but is passing gas. She is taking senokot as prescribed and encouraged her to drink plenty of water. She denies fever or chills. Incisions are dry and intact. Denies any drainage at this time. She rates her pain 1/10 at rest and 6/10 when she is up and moving. Her largest incision on her upper abdomen is the one that is the most sore. Her pain is controlled with alternating Tylenol and Advil. Patient endorses some coughing related to allergies and states it is painful to cough. Encouraged patient to brace her abdomen with pillow when she needs to cough.   Instructed to call office with any fever, chills, purulent drainage, uncontrolled pain, shortness of breath, or any other questions or concerns. Reinforced post op instructions in AVS. Patient verbalizes understanding.   Pt aware of post op appointments as well as the office number [phone] and after hours number [phone] to call if she has any questions or concerns

## 2022-01-02 LAB — CYTOLOGY - NON PAP

## 2022-01-04 LAB — SURGICAL PATHOLOGY

## 2022-01-06 ENCOUNTER — Inpatient Hospital Stay (HOSPITAL_BASED_OUTPATIENT_CLINIC_OR_DEPARTMENT_OTHER): Payer: No Typology Code available for payment source | Admitting: Gynecologic Oncology

## 2022-01-06 ENCOUNTER — Encounter: Payer: Self-pay | Admitting: Gynecologic Oncology

## 2022-01-06 DIAGNOSIS — Z90722 Acquired absence of ovaries, bilateral: Secondary | ICD-10-CM

## 2022-01-06 DIAGNOSIS — Z90710 Acquired absence of both cervix and uterus: Secondary | ICD-10-CM

## 2022-01-06 DIAGNOSIS — D3910 Neoplasm of uncertain behavior of unspecified ovary: Secondary | ICD-10-CM

## 2022-01-06 NOTE — Progress Notes (Signed)
Gynecologic Oncology Telehealth Note: Gyn-Onc  I connected with Emma Hill on 01/06/22 at  4:00 PM EDT by telephone and verified that I am speaking with the correct person using two identifiers.  I discussed the limitations, risks, security and privacy concerns of performing an evaluation and management service by telemedicine and the availability of in-person appointments. I also discussed with the patient that there may be a patient responsible charge related to this service. The patient expressed understanding and agreed to proceed.  Other persons participating in the visit and their role in the encounter: none.  Patient's location: home Provider's location: [facility]  Reason for Visit: follow-up after surgery  Treatment History: 12/29/21: Robotic-assisted laparoscopic total hysterectomy with bilateral salpingoophorectomy, staging including peritoneal biopsies and omental biopsies  Interval History: Doing well, improving daily. Some soreness, no significant pain. Tired at the end of the day. Appetite normal, no nausea. Ambulating more each day. Bowels and bladder at baseline.   Past Medical/Surgical History: Past Medical History:  Diagnosis Date   Allergy    Anemia    Cataract    Depression    Fibromyalgia    Headache    History of endometriosis    Hypertension    Hypothyroidism    Thyroid disease     Past Surgical History:  Procedure Laterality Date   CATARACT EXTRACTION W/ INTRAOCULAR LENS IMPLANT Bilateral    CESAREAN SECTION  1988   DIAGNOSTIC LAPAROSCOPY  1987   EXPLORATORY LAPAROTOMY  1987   Reports diagnostic laparoscopy secondary to concern for endometriosis and infertility.  She ultimately returned for laparotomy secondary to significant endometriosis.   ROBOTIC ASSISTED BILATERAL SALPINGO OOPHERECTOMY Bilateral 12/29/2021   Procedure: XI ROBOTIC ASSISTED BILATERAL SALPINGO OOPHORECTOMY;  Surgeon: Lafonda Mosses, MD;  Location: [facility];  Service:  Gynecology;  Laterality: Bilateral;   ROBOTIC ASSISTED TOTAL HYSTERECTOMY N/A 12/29/2021   Procedure: XI ROBOTIC ASSISTED TOTAL HYSTERECTOMY WITH STAGING;  Surgeon: Lafonda Mosses, MD;  Location: [facility];  Service: Gynecology;  Laterality: N/A;   TONSILECTOMY, ADENOIDECTOMY, BILATERAL MYRINGOTOMY AND TUBES  1975    Family History  Problem Relation Age of Onset   Lung cancer Brother    Pancreatic cancer Brother    Colon cancer Neg Hx    Uterine cancer Neg Hx    Ovarian cancer Neg Hx    Breast cancer Neg Hx     Social History   Socioeconomic History   Marital status: Married    Spouse name: Not on file   Number of children: Not on file   Years of education: Not on file   Highest education level: Not on file  Occupational History   Not on file  Tobacco Use   Smoking status: Former    Types: Cigarettes    Quit date: 1982    Years since quitting: 41.8   Smokeless tobacco: Never  Vaping Use   Vaping Use: Never used  Substance and Sexual Activity   Alcohol use: Yes    Alcohol/week: 14.0 standard drinks of alcohol    Types: 7 Cans of beer, 7 Standard drinks or equivalent per week    Comment: occas.   Drug use: Never   Sexual activity: Yes  Other Topics Concern   Not on file  Social History Narrative   Not on file   Social Determinants of Health   Financial Resource Strain: Not on file  Food Insecurity: Not on file  Transportation Needs: Not on file  Physical Activity: Not on file  Stress: Not on file  Social Connections: Not on file    Current Medications:  Current Outpatient Medications:    atenolol (TENORMIN) 25 MG tablet, Take 25 mg by mouth daily., Disp: , Rfl:    atenolol-chlorthalidone (TENORETIC) 50-25 MG tablet, Take 0.5 tablets by mouth daily., Disp: , Rfl:    CALCIUM PO, Take 1 tablet by mouth daily., Disp: , Rfl:    citalopram (CELEXA) 40 MG tablet, Take 40 mg by mouth daily., Disp: , Rfl:    fluticasone (FLONASE) 50 MCG/ACT nasal spray, Place 2  sprays into both nostrils daily as needed for allergies., Disp: , Rfl:    HYDROcodone-acetaminophen (NORCO) 10-325 MG tablet, Take 1 tablet by mouth 3 (three) times daily as needed for moderate pain., Disp: , Rfl:    HYDROcodone-acetaminophen (NORCO) 10-325 MG tablet, Take 1 tablet by mouth every 4 (four) hours as needed for severe pain (breakthrough pain). For AFTER surgery breakthrough pain, do not take and drive, Disp: 10 tablet, Rfl: 0   levothyroxine (SYNTHROID) 50 MCG tablet, Take 50 mcg by mouth See admin instructions. Take 50 mcg by mouth before breakfast on Saturday and Sunday, Disp: , Rfl:    levothyroxine (SYNTHROID) 75 MCG tablet, Take 75 mcg by mouth every Monday, Tuesday, Wednesday, Thursday, and Friday., Disp: , Rfl:    montelukast (SINGULAIR) 10 MG tablet, Take 10 mg by mouth at bedtime., Disp: , Rfl:    Multiple Vitamin (MULTIVITAMIN WITH MINERALS) TABS tablet, Take 1 tablet by mouth daily., Disp: , Rfl:    pregabalin (LYRICA) 225 MG capsule, Take 225 mg by mouth at bedtime., Disp: , Rfl:    pregabalin (LYRICA) 75 MG capsule, Take 75 mg by mouth every morning., Disp: , Rfl:    senna-docusate (SENOKOT-S) 8.6-50 MG tablet, Take 2 tablets by mouth at bedtime. For AFTER surgery, do not take if having diarrhea, Disp: 30 tablet, Rfl: 0   tiZANidine (ZANAFLEX) 4 MG tablet, Take 4 mg by mouth every 8 (eight) hours as needed for muscle spasms., Disp: , Rfl:    Vitamin D, Ergocalciferol, (DRISDOL) 1.25 MG (50000 UNIT) CAPS capsule, Take 50,000 Units by mouth 2 (two) times a week., Disp: , Rfl:   Review of Symptoms: Pertinent positives as per HPI.  Physical Exam: Deferred given limitations of phone visit.  Laboratory & Radiologic Studies: A. RIGHT OVARY AND FALLOPIAN TUBE, SALPINGO OOPHORECTOMY:  Borderline Brenner tumor  Benign fallopian tube   B. UTERUS WITH LEFT FALLOPIAN TUBE AND OVARY, HYSTERECTOMY AND  SALPINGO-OOPHORECTOMY:  Simple hyperplasia without atypia  Benign  cystadenofibroma  Chronic cervicitis with squamous metaplasia  Benign fallopian tube   C.  LEFT PELVIC SIDEWALL, EXCISION:  Benign mesothelium and fibrous and fibrovascular stroma   D. RIGHT PELVIC SIDEWALL, EXCISION:  Benign mesothelium and fibrous and fibrovascular stroma   E.  POSTERIOR CUL DE SAC, EXCISION:  Benign mesothelium and fibrous and fibrovascular stroma   F.  ANTERIOR CUL DE SAC, EXCISION:  Benign fibrous and fibrovascular stroma   G.  RIGHT PARA COLIC GUTTER, EXCISION:  Benign fibrous and fibrovascular stroma   H.  LEFT PARA COLIC GUTTER, EXCISION:  Benign fibrous and fibrovascular stroma   I. OMENTUM, BIOPSY:  Benign omentum  Negative for tumor   Assessment & Plan: Emma Hill is a 66 y.o. 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  Patient is doing well, meeting postoperative milestones.  Discussed continued expectations and restrictions.  Reviewed pathology with the  patient in detail.  Final pathology shows borderline Brenner tumor, early stage.  Discussed no additional treatment indicated.  We will review closer plan for follow-up at her in person visit.  All questions answered.  I discussed the assessment and treatment plan with the patient. The patient was provided with an opportunity to ask questions and all were answered. The patient agreed with the plan and demonstrated an understanding of the instructions.   The patient was advised to call back or see an in-person evaluation if the symptoms worsen or if the condition fails to improve as anticipated.   8 minutes of total time was spent for this patient encounter, including preparation, phone counseling with the patient and coordination of care, and documentation of the encounter.   Jeral Pinch, MD  Division of Gynecologic Oncology  Department of Obstetrics and Gynecology  [facility]

## 2022-01-20 ENCOUNTER — Inpatient Hospital Stay: Payer: No Typology Code available for payment source | Attending: Gynecologic Oncology | Admitting: Gynecologic Oncology

## 2022-01-20 ENCOUNTER — Other Ambulatory Visit: Payer: Self-pay

## 2022-01-20 ENCOUNTER — Encounter: Payer: Self-pay | Admitting: Gynecologic Oncology

## 2022-01-20 VITALS — BP 125/66 | HR 63 | Temp 98.0°F | Resp 18 | Ht 66.0 in | Wt 170.5 lb

## 2022-01-20 DIAGNOSIS — Z7189 Other specified counseling: Secondary | ICD-10-CM

## 2022-01-20 DIAGNOSIS — Z90710 Acquired absence of both cervix and uterus: Secondary | ICD-10-CM

## 2022-01-20 DIAGNOSIS — D3910 Neoplasm of uncertain behavior of unspecified ovary: Secondary | ICD-10-CM

## 2022-01-20 DIAGNOSIS — Z90722 Acquired absence of ovaries, bilateral: Secondary | ICD-10-CM

## 2022-01-20 NOTE — Progress Notes (Signed)
Gynecologic Oncology Return Clinic Visit  01/20/22  Reason for Visit: post-op follow-up, treatment planning  Treatment History: Patient reports menopause at the age of 66.  3-4 months ago, she had very light vaginal spotting for 1 week with mild cramping.  She has had no bleeding or pain since.  She went to her OB/GYN and underwent pelvic ultrasound which showed a thin endometrial lining but evidence of a solid-appearing mass in the right adnexa.  She subsequently underwent CT scan approximately 2 months later with both a solid as well as bilobed cystic mass noted.   Her surgical history is notable for laparoscopic surgery for infertility and suspected endometriosis.  A mass was seen (she thinks on one of her ovaries) and she had a dermoid cyst removed.  As far as she knows, she kept both ovaries at the time of that surgery.  Shortly after, she underwent laparotomy secondary to what was described as "severe endometriosis" and had endometriosis removed as well as fulgurated at that time.  Subsequently she was able to achieve pregnancy x2 and had a C-section delivery with 1 of these.  Her endometriosis related surgeries were approximately 35 years ago.  Remembers vaguely being told something about a uterine fibroid after the surgeries.   09/24/20: Pelvic ultrasound at [place] on 7/15. Uterus measures 3.4 x 3.9 x 2.4 cm with an endometrial lining of 2.2 mm.  Solid mass in the right adnexa measuring 3.5 x 2.6 x 2.5 cm noted.   11/19/20: CT A/P. Large solid mass in the right side of the pelvis measuring up to 8.3 cm abutting the right side of the uterus and AA 4.7 cm septated cystic mass in the posterior pelvis, likely right ovary. It is difficult to determine if this solid mass is arising from the uterus or the right ovary. Recommend further evaluation with MRI.   Labs on 11/27/20:  CA-125: 11.8 CEA: 2.1 CA 19-9: 5   12/11/2020: Pelvic MRI.  Uterus measures 5.1 x 3 x 3.6 cm.  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  Left ovary is normal in appearance.  No ascites.  No adenopathy.   03/18/2021: Pelvic ultrasound.  Unremarkable uterus.  Nonvisualized left ovary.  2 complex cystic lesions within the right ovary measuring 5.5 cm and 4.2 cm containing diffuse low-level internal echogenicity suspicious for endometriomas, though complicated hemorrhagic cyst could have similar appearance.  8.3 cm diameter complex cystic mass in the right lateral aspect of the uterus, contiguous with the right ovary and the complex cystic lesion at the right lateral aspect of the uterus.  Sonographically, this lesion cannot be differentiated between exophytic uterine mass and a right ovarian solid tumor.   06/16/21: Pelvic MRI.  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  Lesion is most likely benign.  Multicystic lesion posteriorly has increased in size now measuring 6.7 x 5.3 cm.  Enlargement is worrisome for cystic ovarian malignancy despite signal characteristics which suggest endometriomas.  No adenopathy or metastatic disease in the pelvis.  12/29/21: Robotic-assisted laparoscopic total hysterectomy with bilateral salpingoophorectomy, staging including peritoneal biopsies and  omental biopsies   Interval History: Overall doing very well.  Notes some continued fatigue.  Has intermittent abdominal discomfort, overall improving daily.  Has noticed some hot flashes since surgery.  Was very weepy initially but this has resolved.  For the last 1-2 weeks, is having some light vaginal spotting.   She wears a pad during the day and notices a couple very small spots on the pad.  Denies any heavy bleeding or pelvic pain.  Reports regular bowel and bladder function.  Past Medical/Surgical History: Past Medical History:  Diagnosis Date   Allergy    Anemia    Cataract    Depression    Fibromyalgia    Headache    History of endometriosis    Hypertension    Hypothyroidism    Thyroid disease     Past Surgical History:  Procedure Laterality Date   CATARACT EXTRACTION W/ INTRAOCULAR LENS IMPLANT Bilateral    CESAREAN SECTION  1988   DIAGNOSTIC LAPAROSCOPY  1987   EXPLORATORY LAPAROTOMY  1987   Reports diagnostic laparoscopy secondary to concern for endometriosis and infertility.  She ultimately returned for laparotomy secondary to significant endometriosis.   ROBOTIC ASSISTED BILATERAL SALPINGO OOPHERECTOMY Bilateral 12/29/2021   Procedure: XI ROBOTIC ASSISTED BILATERAL SALPINGO OOPHORECTOMY;  Surgeon: Lafonda Mosses, MD;  Location: [facility];  Service: Gynecology;  Laterality: Bilateral;   ROBOTIC ASSISTED TOTAL HYSTERECTOMY N/A 12/29/2021   Procedure: XI ROBOTIC ASSISTED TOTAL HYSTERECTOMY WITH STAGING;  Surgeon: Lafonda Mosses, MD;  Location: [facility];  Service: Gynecology;  Laterality: N/A;   TONSILECTOMY, ADENOIDECTOMY, BILATERAL MYRINGOTOMY AND TUBES  1975    Family History  Problem Relation Age of Onset   Lung cancer Brother    Pancreatic cancer Brother    Colon cancer Neg Hx    Uterine cancer Neg Hx    Ovarian cancer Neg Hx    Breast cancer Neg Hx     Social History   Socioeconomic History   Marital status: Married    Spouse name: Not on file   Number of children: Not on file   Years of education: Not on file   Highest education level: Not on file  Occupational History   Not on file  Tobacco Use   Smoking status: Former    Types: Cigarettes    Quit date: 1982    Years since quitting: 41.8   Smokeless tobacco: Never  Vaping Use   Vaping Use: Never  used  Substance and Sexual Activity   Alcohol use: Yes    Alcohol/week: 14.0 standard drinks of alcohol    Types: 7 Cans of beer, 7 Standard drinks or equivalent per week    Comment: occas.   Drug use: Never   Sexual activity: Yes  Other Topics Concern   Not on file  Social History Narrative   Not on file   Social Determinants of Health   Financial Resource Strain: Not on file  Food Insecurity: Not on file  Transportation Needs: Not on file  Physical Activity: Not on file  Stress: Not on file  Social Connections: Not on file    Current Medications:  Current Outpatient Medications:    atenolol (TENORMIN) 25 MG tablet, Take 25 mg by mouth daily., Disp: , Rfl:    atenolol-chlorthalidone (TENORETIC) 50-25 MG tablet, Take 0.5 tablets by mouth daily., Disp: , Rfl:    CALCIUM PO, Take 1 tablet by mouth daily., Disp: , Rfl:    citalopram (CELEXA) 40 MG tablet, Take 40  mg by mouth daily., Disp: , Rfl:    fluticasone (FLONASE) 50 MCG/ACT nasal spray, Place 2 sprays into both nostrils daily as needed for allergies., Disp: , Rfl:    HYDROcodone-acetaminophen (NORCO) 10-325 MG tablet, Take 1 tablet by mouth 3 (three) times daily as needed for moderate pain., Disp: , Rfl:    levothyroxine (SYNTHROID) 50 MCG tablet, Take 50 mcg by mouth See admin instructions. Take 50 mcg by mouth before breakfast on Saturday and Sunday, Disp: , Rfl:    levothyroxine (SYNTHROID) 75 MCG tablet, Take 75 mcg by mouth every Monday, Tuesday, Wednesday, Thursday, and Friday., Disp: , Rfl:    montelukast (SINGULAIR) 10 MG tablet, Take 10 mg by mouth at bedtime., Disp: , Rfl:    Multiple Vitamin (MULTIVITAMIN WITH MINERALS) TABS tablet, Take 1 tablet by mouth daily., Disp: , Rfl:    pregabalin (LYRICA) 225 MG capsule, Take 225 mg by mouth at bedtime., Disp: , Rfl:    pregabalin (LYRICA) 75 MG capsule, Take 75 mg by mouth every morning., Disp: , Rfl:    tiZANidine (ZANAFLEX) 4 MG tablet, Take 4 mg by mouth every 8  (eight) hours as needed for muscle spasms., Disp: , Rfl:    Vitamin D, Ergocalciferol, (DRISDOL) 1.25 MG (50000 UNIT) CAPS capsule, Take 50,000 Units by mouth 2 (two) times a week., Disp: , Rfl:   Review of Systems: Denies appetite changes, fevers, chills, unexplained weight changes. Denies hearing loss, neck lumps or masses, mouth sores, ringing in ears or voice changes. Denies cough or wheezing.  Denies shortness of breath. Denies chest pain or palpitations. Denies leg swelling. Denies abdominal distention, blood in stools, constipation, diarrhea, nausea, vomiting, or early satiety. Denies pain with intercourse, dysuria, frequency, hematuria or incontinence. Denies pelvic pain or vaginal discharge.   Denies joint pain, back pain or muscle pain/cramps. Denies itching, rash, or wounds. Denies dizziness, headaches, numbness or seizures. Denies swollen lymph nodes or glands, denies easy bruising or bleeding. Denies anxiety, depression, confusion, or decreased concentration.  Physical Exam: BP 125/66 (BP Location: Left Arm, Patient Position: Sitting)   Pulse 63   Temp 98 F (36.7 C) (Oral)   Resp 18   Ht '5\' 6"'$  (1.676 m)   Wt 170 lb 8 oz (77.3 kg)   SpO2 99%   BMI 27.52 kg/m  General: Alert, oriented, no acute distress. HEENT: Normocephalic, atraumatic, sclera anicteric. Chest: Unlabored breathing on room air. Abdomen: soft, nontender.  Normoactive bowel sounds.  No masses or hepatosplenomegaly appreciated.  Well-healed scar. Extremities: Grossly normal range of motion.  Warm, well perfused.  No edema bilaterally. GU: Normal appearing external genitalia without erythema, excoriation, or lesions.  Speculum exam reveals cuff intact, very minimal blood at the apex, removed with a scopette.  No active bleeding noted.  Sutures intact, cuff intact.  Bimanual exam reveals cuff intact, no fluctuance or tenderness to palpation.    Laboratory & Radiologic Studies: A. RIGHT OVARY AND FALLOPIAN  TUBE, SALPINGO OOPHORECTOMY: Borderline Brenner tumor Benign fallopian tube  B. 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  LEFT PELVIC SIDEWALL, EXCISION: Benign mesothelium and fibrous and fibrovascular stroma  D. RIGHT PELVIC SIDEWALL, EXCISION: Benign mesothelium and fibrous and fibrovascular stroma  E.  POSTERIOR CUL DE SAC, EXCISION: Benign mesothelium and fibrous and fibrovascular stroma  F.  ANTERIOR CUL DE SAC, EXCISION: Benign fibrous and fibrovascular stroma  G.  RIGHT PARA COLIC GUTTER, EXCISION: Benign fibrous and fibrovascular stroma  H.  LEFT PARA COLIC GUTTER, EXCISION: Benign fibrous and fibrovascular stroma  I. OMENTUM, BIOPSY: Benign omentum Negative for tumor   Cytology FINAL MICROSCOPIC DIAGNOSIS:  - No malignant cells identified  - Benign mesothelial cells and mesothelium with chronic inflammatory  cells  - Rare benign columnar epithelium suggestive of endosalpingosis   Assessment & Plan: Emma Hill is a 66 y.o. 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  Patient is overall doing well after surgery, meeting milestones.  Discussed very light spotting that she has been having.  I suspect this may be related to some suture that is beginning to dissolve.  I do not see any granulation tissue or obvious cause for the spotting on exam today.  The cuff itself is intact and overall healing well.  No erythema or evidence of infection.  Reviewed continued restrictions.  We reviewed her pathology from surgery.  The patient was given a copy of her pathology report.  We discussed her simple hyperplasia within the uterus as well as her borderline Signa Kell tumor of the ovary.  Discussed hot flashes. Reviewed options for treatment if  these are bothersome.  We discussed follow-up plan in the setting of a borderline tumor.  After BSO, there is still a risk of recurrence of borderline tumor (estimated to be less than 10%).  Very rarely, borderline tumors can recur as invasive carcinomas.  There are no strict guidelines in terms of how closely to follow patients who are diagnosed with a borderline tumor.  NCCN guidelines include visits as frequently as every 3 months, as if we were following a patient who had been diagnosed with invasive cancer.  [facility] recommends yearly visits after definitive surgery with BSO.  After discussion today, the patient would like to start with surveillance visits every 6 months.  We have decided that this will initially be with me only but that after several visits, we will consider transitioning to visits alternating between her OB/GYN in my office.  There did not seem to be a tumor marker for her before surgery, so we will not plan to get a tumor marker with her surveillance visits.  Visits will involve review of systems and physical exam.  Any imaging would be indicated based on new symptoms or exam findings.  We reviewed signs and symptoms that should prompt a phone call before her next scheduled visit.  24 minutes of total time was spent for this patient encounter, including preparation, face-to-face counseling with the patient and coordination of care, and documentation of the encounter.  Jeral Pinch, MD  Division of Gynecologic Oncology  Department of Obstetrics and Gynecology  [facility]

## 2022-01-20 NOTE — Patient Instructions (Signed)
It was good to see you today.  You are healing well from surgery.  Please remember, no heavy lifting for 6 weeks after surgery and nothing in the vagina for at least 8-10.  The top of the vagina looks like it is healing very well.  I suspect the little bit of spotting you are having is related to stitches that are starting to dissolve.  If the spotting does not stop or it increases at all, please call and let me know.  In terms of your borderline tumor, we discussed plan for follow-up today with a visit every 6 months.  If you develop any of the signs and symptoms that we reviewed before your next visit, please call to see me sooner.

## 2022-07-20 ENCOUNTER — Encounter: Payer: Self-pay | Admitting: Gynecologic Oncology

## 2022-07-21 ENCOUNTER — Other Ambulatory Visit: Payer: Self-pay

## 2022-07-21 ENCOUNTER — Inpatient Hospital Stay: Payer: No Typology Code available for payment source | Attending: Gynecologic Oncology | Admitting: Gynecologic Oncology

## 2022-07-21 ENCOUNTER — Encounter: Payer: Self-pay | Admitting: Gynecologic Oncology

## 2022-07-21 VITALS — BP 112/55 | HR 65 | Temp 97.9°F | Wt 169.0 lb

## 2022-07-21 DIAGNOSIS — Z90722 Acquired absence of ovaries, bilateral: Secondary | ICD-10-CM | POA: Insufficient documentation

## 2022-07-21 DIAGNOSIS — D3910 Neoplasm of uncertain behavior of unspecified ovary: Secondary | ICD-10-CM

## 2022-07-21 DIAGNOSIS — Z8742 Personal history of other diseases of the female genital tract: Secondary | ICD-10-CM | POA: Insufficient documentation

## 2022-07-21 DIAGNOSIS — Z90710 Acquired absence of both cervix and uterus: Secondary | ICD-10-CM | POA: Insufficient documentation

## 2022-07-21 NOTE — Patient Instructions (Signed)
It was good to see you today.  I do not see or feel any evidence of borderline tumor recurrence on your exam.  I will see you for follow-up in 6 months.  At that visit, if you are still doing well, we will talk about transitioning to alternating visits with your OB/GYN.  As always, if you develop any new and concerning symptoms before your next visit, please call to see me sooner.

## 2022-07-21 NOTE — Progress Notes (Signed)
Gynecologic Oncology Return Clinic Visit  07/21/22  Reason for Visit: surveillance  Treatment History: Patient reports menopause at the age of 47.  3-4 months ago, she had very light vaginal spotting for 1 week with mild cramping.  She has had no bleeding or pain since.  She went to her OB/GYN and underwent pelvic ultrasound which showed a thin endometrial lining but evidence of a solid-appearing mass in the right adnexa.  She subsequently underwent CT scan approximately 2 months later with both a solid as well as bilobed cystic mass noted.   Her surgical history is notable for laparoscopic surgery for infertility and suspected endometriosis.  A mass was seen (she thinks on one of her ovaries) and she had a dermoid cyst removed.  As far as she knows, she kept both ovaries at the time of that surgery.  Shortly after, she underwent laparotomy secondary to what was described as "severe endometriosis" and had endometriosis removed as well as fulgurated at that time.  Subsequently she was able to achieve pregnancy x2 and had a C-section delivery with 1 of these.  Her endometriosis related surgeries were approximately 35 years ago.  Remembers vaguely being told something about a uterine fibroid after the surgeries.   09/24/20: Pelvic ultrasound at [facility] on 7/15. Uterus measures 3.4 x 3.9 x 2.4 cm with an endometrial lining of 2.2 mm.  Solid mass in the right adnexa measuring 3.5 x 2.6 x 2.5 cm noted.   11/19/20: CT A/P. Large solid mass in the right side of the pelvis measuring up to 8.3 cm abutting the right side of the uterus and AA 4.7 cm septated cystic mass in the posterior pelvis, likely right ovary. It is difficult to determine if this solid mass is arising from the uterus or the right ovary. Recommend further evaluation with MRI.   Labs on 11/27/20:  CA-125: 11.8 CEA: 2.1 CA 19-9: 5   12/11/2020: Pelvic MRI.  Uterus measures 5.1 x 3 x 3.6 cm.  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  Left ovary is normal in appearance.  No ascites.  No adenopathy.   03/18/2021: Pelvic ultrasound.  Unremarkable uterus.  Nonvisualized left ovary.  2 complex cystic lesions within the right ovary measuring 5.5 cm and 4.2 cm containing diffuse low-level internal echogenicity suspicious for endometriomas, though complicated hemorrhagic cyst could have similar appearance.  8.3 cm diameter complex cystic mass in the right lateral aspect of the uterus, contiguous with the right ovary and the complex cystic lesion at the right lateral aspect of the uterus.  Sonographically, this lesion cannot be differentiated between exophytic uterine mass and a right ovarian solid tumor.   06/16/21: Pelvic MRI.  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  Lesion is most likely benign.  Multicystic lesion posteriorly has increased in size now measuring 6.7 x 5.3 cm.  Enlargement is worrisome for cystic ovarian malignancy despite signal characteristics which suggest endometriomas.  No adenopathy or metastatic disease in the pelvis.   12/29/21: Robotic-assisted laparoscopic total hysterectomy with bilateral salpingoophorectomy, staging including peritoneal biopsies and omental biopsies.  Final pathology revealed a borderline Brenner tumor.  Interval History: Doing well.  Denies any abdominal or pelvic pain.  Reports baseline bowel bladder function.  Denies any vaginal bleeding or discharge.  Endorses good appetite without nausea or emesis.  Looking forward to a beach trip with her 2 grown daughters and good  friend to [facility] this weekend for Mother's Day.  Past Medical/Surgical History: Past Medical History:  Diagnosis Date   Allergy    Anemia    Cataract    Depression    Fibromyalgia    Headache    History of endometriosis    Hypertension    Hypothyroidism    Thyroid disease     Past Surgical History:  Procedure Laterality Date   CATARACT EXTRACTION W/ INTRAOCULAR LENS IMPLANT Bilateral    CESAREAN SECTION  1988   DIAGNOSTIC LAPAROSCOPY  1987   EXPLORATORY LAPAROTOMY  1987   Reports diagnostic laparoscopy secondary to concern for endometriosis and infertility.  She ultimately returned for laparotomy secondary to significant endometriosis.   ROBOTIC ASSISTED BILATERAL SALPINGO OOPHERECTOMY Bilateral 12/29/2021   Procedure: XI ROBOTIC ASSISTED BILATERAL SALPINGO OOPHORECTOMY;  Surgeon: Carver Fila, MD;  Location: [facility];  Service: Gynecology;  Laterality: Bilateral;   ROBOTIC ASSISTED TOTAL HYSTERECTOMY N/A 12/29/2021   Procedure: XI ROBOTIC ASSISTED TOTAL HYSTERECTOMY WITH STAGING;  Surgeon: Carver Fila, MD;  Location: [facility];  Service: Gynecology;  Laterality: N/A;   TONSILECTOMY, ADENOIDECTOMY, BILATERAL MYRINGOTOMY AND TUBES  1975    Family History  Problem Relation Age of Onset   Lung cancer Brother    Pancreatic cancer Brother    Colon cancer Neg Hx    Uterine cancer Neg Hx    Ovarian cancer Neg Hx    Breast cancer Neg Hx     Social History   Socioeconomic History   Marital status: Married    Spouse name: Not on file   Number of children: Not on file   Years of education: Not on file   Highest education level: Not on file  Occupational History   Not on file  Tobacco Use   Smoking status: Former    Types: Cigarettes    Quit date: 1982    Years since quitting: 42.3   Smokeless tobacco: Never  Vaping Use   Vaping Use: Never used  Substance and Sexual Activity   Alcohol use: Yes    Alcohol/week: 14.0 standard drinks of alcohol    Types:  7 Cans of beer, 7 Standard drinks or equivalent per week    Comment: occas.   Drug use: Never   Sexual activity: Yes  Other Topics Concern   Not on file  Social History Narrative   Not on file   Social Determinants of Health   Financial Resource Strain: Not on file  Food Insecurity: Not on file  Transportation Needs: Not on file  Physical Activity: Not on file  Stress: Not on file  Social Connections: Not on file    Current Medications:  Current Outpatient Medications:    atenolol (TENORMIN) 25 MG tablet, Take 25 mg by mouth daily., Disp: , Rfl:    atenolol-chlorthalidone (TENORETIC) 50-25 MG tablet, Take 0.5 tablets by mouth daily., Disp: , Rfl:    CALCIUM PO, Take 1 tablet by mouth daily., Disp: , Rfl:    citalopram (CELEXA) 40 MG tablet, Take 40 mg by mouth daily., Disp: , Rfl:    fluticasone (FLONASE) 50 MCG/ACT nasal spray, Place 2 sprays into both nostrils  daily as needed for allergies., Disp: , Rfl:    HYDROcodone-acetaminophen (NORCO) 10-325 MG tablet, Take 1 tablet by mouth 3 (three) times daily as needed for moderate pain., Disp: , Rfl:    levothyroxine (SYNTHROID) 50 MCG tablet, Take 50 mcg by mouth See admin instructions. Take 50 mcg by mouth before breakfast on Saturday and Sunday, Disp: , Rfl:    levothyroxine (SYNTHROID) 75 MCG tablet, Take 75 mcg by mouth every Monday, Tuesday, Wednesday, Thursday, and Friday., Disp: , Rfl:    montelukast (SINGULAIR) 10 MG tablet, Take 10 mg by mouth at bedtime., Disp: , Rfl:    Multiple Vitamin (MULTIVITAMIN WITH MINERALS) TABS tablet, Take 1 tablet by mouth daily., Disp: , Rfl:    pregabalin (LYRICA) 225 MG capsule, Take 225 mg by mouth at bedtime., Disp: , Rfl:    pregabalin (LYRICA) 75 MG capsule, Take 75 mg by mouth every morning., Disp: , Rfl:    tiZANidine (ZANAFLEX) 4 MG tablet, Take 4 mg by mouth every 8 (eight) hours as needed for muscle spasms., Disp: , Rfl:    Vitamin D, Ergocalciferol, (DRISDOL) 1.25 MG (50000 UNIT)  CAPS capsule, Take 50,000 Units by mouth 2 (two) times a week., Disp: , Rfl:   Review of Systems: Denies appetite changes, fevers, chills, fatigue, unexplained weight changes. Denies hearing loss, neck lumps or masses, mouth sores, ringing in ears or voice changes. Denies cough or wheezing.  Denies shortness of breath. Denies chest pain or palpitations. Denies leg swelling. Denies abdominal distention, pain, blood in stools, constipation, diarrhea, nausea, vomiting, or early satiety. Denies pain with intercourse, dysuria, frequency, hematuria or incontinence. Denies hot flashes, pelvic pain, vaginal bleeding or vaginal discharge.   Denies joint pain, back pain or muscle pain/cramps. Denies itching, rash, or wounds. Denies dizziness, headaches, numbness or seizures. Denies swollen lymph nodes or glands, denies easy bruising or bleeding. Denies anxiety, depression, confusion, or decreased concentration.  Physical Exam: BP (!) 112/55 (BP Location: Left Arm, Patient Position: Sitting)   Pulse 65   Temp 97.9 F (36.6 C) (Oral)   Wt 169 lb (76.7 kg)   SpO2 95%   BMI 27.28 kg/m  General: Alert, oriented, no acute distress. HEENT: Normocephalic, atraumatic, sclera anicteric. Chest: Clear to auscultation bilaterally.  No wheezes or rhonchi. Cardiovascular: Regular rate and rhythm, no murmurs. Abdomen: soft, nontender.  Normoactive bowel sounds.  No masses or hepatosplenomegaly appreciated.  Well-healed incisions. Extremities: Grossly normal range of motion.  Warm, well perfused.  No edema bilaterally. Skin: No rashes or lesions noted. Lymphatics: No cervical, supraclavicular, or inguinal adenopathy. GU: Normal appearing external genitalia without erythema, excoriation, or lesions.  Speculum exam reveals cuff intact, no lesions.  Bimanual exam reveals no nodularity or masses palpated.    Laboratory & Radiologic Studies: None new  Assessment & Plan: JAMEEKA DEEMS is a 67 y.o. 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 Surgery 12/2021.   Patient is doing well. NED on exam.   We have previously discussed follow-up plan in the setting of a borderline tumor.  After BSO, there is still a risk of recurrence of borderline tumor (estimated to be less than 10%).  Very rarely, borderline tumors can recur as invasive carcinomas.  There are no strict guidelines in terms of how closely to follow patients who are diagnosed with a borderline tumor.  NCCN guidelines include visits as frequently as every 3 months, as if we  were following a patient who had been diagnosed with invasive cancer.  SGO recommends yearly visits after definitive surgery with BSO.  We had previously decided on surveillance visits initially every 6 months.  We will plan on one additional visit with me in 6 months.  Then we will addition to alternating visits between my office and her OB/GYN.    There did not seem to be a tumor marker for her before surgery, so we will not plan to get a tumor marker with her surveillance visits.  Visits will involve review of systems and physical exam.  Any imaging would be indicated based on new symptoms or exam findings.   We reviewed signs and symptoms that should prompt a phone call before her next scheduled visit.  20 minutes of total time was spent for this patient encounter, including preparation, face-to-face counseling with the patient and coordination of care, and documentation of the encounter.  Eugene Garnet, MD  Division of Gynecologic Oncology  Department of Obstetrics and Gynecology  [facility]

## 2022-12-01 ENCOUNTER — Other Ambulatory Visit: Payer: Self-pay | Admitting: Family Medicine

## 2022-12-01 DIAGNOSIS — M8580 Other specified disorders of bone density and structure, unspecified site: Secondary | ICD-10-CM

## 2023-01-25 ENCOUNTER — Inpatient Hospital Stay: Payer: 59 | Attending: Gynecologic Oncology | Admitting: Gynecologic Oncology

## 2023-01-25 ENCOUNTER — Encounter: Payer: Self-pay | Admitting: Gynecologic Oncology

## 2023-01-25 VITALS — BP 126/63 | HR 60 | Temp 97.6°F | Resp 20 | Wt 170.0 lb

## 2023-01-25 DIAGNOSIS — Z9079 Acquired absence of other genital organ(s): Secondary | ICD-10-CM | POA: Diagnosis not present

## 2023-01-25 DIAGNOSIS — Z86018 Personal history of other benign neoplasm: Secondary | ICD-10-CM | POA: Diagnosis present

## 2023-01-25 DIAGNOSIS — Z90710 Acquired absence of both cervix and uterus: Secondary | ICD-10-CM | POA: Insufficient documentation

## 2023-01-25 DIAGNOSIS — Z90722 Acquired absence of ovaries, bilateral: Secondary | ICD-10-CM | POA: Diagnosis not present

## 2023-01-25 DIAGNOSIS — D3910 Neoplasm of uncertain behavior of unspecified ovary: Secondary | ICD-10-CM

## 2023-01-25 NOTE — Patient Instructions (Signed)
It was good to see you today.  I do not see or feel any evidence of cancer recurrence on your exam.  I will see you for follow-up in 12 months.  Please reach out to your OB/GYN for a follow-up in 6 months.  Please call my clinic sometime over the summer next year to schedule visit to see me in November 2025.  As always, if you develop any new and concerning symptoms before your next visit, please call to see me sooner.

## 2023-01-25 NOTE — Progress Notes (Signed)
Gynecologic Oncology Return Clinic Visit  01/25/23  Reason for Visit: surveillance   Treatment History: Patient reports menopause at the age of 67.  3-4 months ago, she had very light vaginal spotting for 1 week with mild cramping.  She has had no bleeding or pain since.  She went to her OB/GYN and underwent pelvic ultrasound which showed a thin endometrial lining but evidence of a solid-appearing mass in the right adnexa.  She subsequently underwent CT scan approximately 2 months later with both a solid as well as bilobed cystic mass noted.   Her surgical history is notable for laparoscopic surgery for infertility and suspected endometriosis.  A mass was seen (she thinks on one of her ovaries) and she had a dermoid cyst removed.  As far as she knows, she kept both ovaries at the time of that surgery.  Shortly after, she underwent laparotomy secondary to what was described as "severe endometriosis" and had endometriosis removed as well as fulgurated at that time.  Subsequently she was able to achieve pregnancy x2 and had a C-section delivery with 1 of these.  Her endometriosis related surgeries were approximately 35 years ago.  Remembers vaguely being told something about a uterine fibroid after the surgeries.   09/24/20: Pelvic ultrasound at [facility] on 7/15. Uterus measures 3.4 x 3.9 x 2.4 cm with an endometrial lining of 2.2 mm.  Solid mass in the right adnexa measuring 3.5 x 2.6 x 2.5 cm noted.   11/19/20: CT A/P. Large solid mass in the right side of the pelvis measuring up to 8.3 cm abutting the right side of the uterus and AA 4.7 cm septated cystic mass in the posterior pelvis, likely right ovary. It is difficult to determine if this solid mass is arising from the uterus or the right ovary. Recommend further evaluation with MRI.   Labs on 11/27/20:  CA-125: 11.8 CEA: 2.1 CA 19-9: 5   12/11/2020: Pelvic MRI.  Uterus measures 5.1 x 3 x 3.6 cm.  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  Left ovary is normal in appearance.  No ascites.  No adenopathy.   03/18/2021: Pelvic ultrasound.  Unremarkable uterus.  Nonvisualized left ovary.  2 complex cystic lesions within the right ovary measuring 5.5 cm and 4.2 cm containing diffuse low-level internal echogenicity suspicious for endometriomas, though complicated hemorrhagic cyst could have similar appearance.  8.3 cm diameter complex cystic mass in the right lateral aspect of the uterus, contiguous with the right ovary and the complex cystic lesion at the right lateral aspect of the uterus.  Sonographically, this lesion cannot be differentiated between exophytic uterine mass and a right ovarian solid tumor.   06/16/21: Pelvic MRI.  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  Lesion is most likely benign.  Multicystic lesion posteriorly has increased in size now measuring 6.7 x 5.3 cm.  Enlargement is worrisome for cystic ovarian malignancy despite signal characteristics which suggest endometriomas.  No adenopathy or metastatic disease in the pelvis.   12/29/21: Robotic-assisted laparoscopic total hysterectomy with bilateral salpingoophorectomy, staging including peritoneal biopsies and omental  biopsies. Final pathology revealed a borderline Brenner tumor.  Interval History: Doing well.  Denies any abdominal or pelvic pain.  Reports baseline bowel bladder function.  Looking forward to going to [place] for Thanksgiving to spend time with her grandchildren.  Past Medical/Surgical History: Past Medical History:   Diagnosis Date   Allergy    Anemia    Cataract    Depression    Fibromyalgia    Headache    History of endometriosis    Hypertension    Hypothyroidism    Thyroid disease     Past Surgical History:  Procedure Laterality Date   CATARACT EXTRACTION W/ INTRAOCULAR LENS IMPLANT Bilateral    CESAREAN SECTION  1988   DIAGNOSTIC LAPAROSCOPY  1987   EXPLORATORY LAPAROTOMY  1987   Reports diagnostic laparoscopy secondary to concern for endometriosis and infertility.  She ultimately returned for laparotomy secondary to significant endometriosis.   ROBOTIC ASSISTED BILATERAL SALPINGO OOPHERECTOMY Bilateral 12/29/2021   Procedure: XI ROBOTIC ASSISTED BILATERAL SALPINGO OOPHORECTOMY;  Surgeon: Carver Fila, MD;  Location: [facility];  Service: Gynecology;  Laterality: Bilateral;   ROBOTIC ASSISTED TOTAL HYSTERECTOMY N/A 12/29/2021   Procedure: XI ROBOTIC ASSISTED TOTAL HYSTERECTOMY WITH STAGING;  Surgeon: Carver Fila, MD;  Location: [facility];  Service: Gynecology;  Laterality: N/A;   TONSILECTOMY, ADENOIDECTOMY, BILATERAL MYRINGOTOMY AND TUBES  1975    Family History  Problem Relation Age of Onset   Lung cancer Brother    Pancreatic cancer Brother    Colon cancer Neg Hx    Uterine cancer Neg Hx    Ovarian cancer Neg Hx    Breast cancer Neg Hx     Social History   Socioeconomic History   Marital status: Married    Spouse name: Not on file   Number of children: Not on file   Years of education: Not on file   Highest education level: Not on file  Occupational History   Not on file  Tobacco Use   Smoking status: Former    Current packs/day: 0.00    Types: Cigarettes    Quit date: 38    Years since quitting: 42.8   Smokeless tobacco: Never  Vaping Use   Vaping status: Never Used  Substance and Sexual Activity   Alcohol use: Yes    Alcohol/week: 14.0 standard drinks of alcohol    Types: 7 Cans of beer, 7 Standard drinks or equivalent per week    Comment: occas.    Drug use: Never   Sexual activity: Yes  Other Topics Concern   Not on file  Social History Narrative   Not on file   Social Determinants of Health   Financial Resource Strain: Not on file  Food Insecurity: Not on file  Transportation Needs: Not on file  Physical Activity: Not on file  Stress: Not on file  Social Connections: Unknown (07/23/2021)   Received from [facility]   Social Network    Social Network: Not on file    Current Medications:  Current Outpatient Medications:    atenolol (TENORMIN) 25 MG tablet, Take 25 mg by mouth daily., Disp: , Rfl:    atenolol-chlorthalidone (TENORETIC) 50-25 MG tablet, Take 0.5 tablets by mouth daily., Disp: , Rfl:    CALCIUM PO, Take 1 tablet by mouth daily., Disp: , Rfl:    citalopram (CELEXA) 40 MG tablet, Take 40 mg by mouth daily., Disp: , Rfl:    fluticasone (FLONASE) 50 MCG/ACT nasal spray, Place 2 sprays  into both nostrils daily as needed for allergies., Disp: , Rfl:    HYDROcodone-acetaminophen (NORCO) 10-325 MG tablet, Take 1 tablet by mouth 3 (three) times daily as needed for moderate pain., Disp: , Rfl:    levothyroxine (SYNTHROID) 50 MCG tablet, Take 50 mcg by mouth See admin instructions. Take 50 mcg by mouth before breakfast on Saturday and Sunday, Disp: , Rfl:    levothyroxine (SYNTHROID) 75 MCG tablet, Take 75 mcg by mouth every Monday, Tuesday, Wednesday, Thursday, and Friday., Disp: , Rfl:    montelukast (SINGULAIR) 10 MG tablet, Take 10 mg by mouth at bedtime., Disp: , Rfl:    Multiple Vitamin (MULTIVITAMIN WITH MINERALS) TABS tablet, Take 1 tablet by mouth daily., Disp: , Rfl:    pregabalin (LYRICA) 225 MG capsule, Take 225 mg by mouth at bedtime., Disp: , Rfl:    pregabalin (LYRICA) 75 MG capsule, Take 75 mg by mouth every morning., Disp: , Rfl:    tiZANidine (ZANAFLEX) 4 MG tablet, Take 4 mg by mouth every 8 (eight) hours as needed for muscle spasms., Disp: , Rfl:    Vitamin D, Ergocalciferol, (DRISDOL)  1.25 MG (50000 UNIT) CAPS capsule, Take 50,000 Units by mouth 2 (two) times a week., Disp: , Rfl:   Review of Systems: Denies appetite changes, fevers, chills, fatigue, unexplained weight changes. Denies hearing loss, neck lumps or masses, mouth sores, ringing in ears or voice changes. Denies cough or wheezing.  Denies shortness of breath. Denies chest pain or palpitations. Denies leg swelling. Denies abdominal distention, pain, blood in stools, constipation, diarrhea, nausea, vomiting, or early satiety. Denies pain with intercourse, dysuria, frequency, hematuria or incontinence. Denies hot flashes, pelvic pain, vaginal bleeding or vaginal discharge.   Denies joint pain, back pain or muscle pain/cramps. Denies itching, rash, or wounds. Denies dizziness, headaches, numbness or seizures. Denies swollen lymph nodes or glands, denies easy bruising or bleeding. Denies anxiety, depression, confusion, or decreased concentration.  Physical Exam: BP 126/63 (BP Location: Right Arm, Patient Position: Sitting)   Pulse 60   Temp 97.6 F (36.4 C) (Oral)   Resp 20   Wt 170 lb (77.1 kg)   SpO2 99%   BMI 27.44 kg/m  General: Alert, oriented, no acute distress. HEENT: Normocephalic, atraumatic, sclera anicteric. Chest: Clear to auscultation bilaterally.  No wheezes or rhonchi. Cardiovascular: Regular rate and rhythm, no murmurs. Abdomen: soft, nontender.  Normoactive bowel sounds.  No masses or hepatosplenomegaly appreciated.  Well-healed incisions. Extremities: Grossly normal range of motion.  Warm, well perfused.  No edema bilaterally. Skin: No rashes or lesions noted. Lymphatics: No cervical, supraclavicular, or inguinal adenopathy. GU: Normal appearing external genitalia without erythema, excoriation, or lesions.  Speculum exam reveals cuff intact, no lesions.  Bimanual exam reveals no nodularity or masses palpated.   Laboratory & Radiologic Studies: None new  Assessment & Plan: Emma Hill is a 67 y.o. 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 Surgery 12/2021.   Patient is doing well. NED on exam.   We have previously discussed follow-up plan in the setting of a borderline tumor.  After BSO, there is still a risk of recurrence of borderline tumor (estimated to be less than 10%).  Very rarely, borderline tumors can recur as invasive carcinomas.  There are no strict guidelines in terms of how closely to follow patients who are diagnosed with a borderline tumor.  NCCN guidelines include visits as frequently as every  3 months, as if we were following a patient who had been diagnosed with invasive cancer.  SGO recommends yearly visits after definitive surgery with BSO.  We had previously decided on surveillance visits initially every 6 months.  At this time, we will transition to visits every 6 months alternating between my office and her OB/GYN.  She will reach out for a 55-month follow-up with her OB/GYN and see me back in 1 year.     There was not a tumor marker at the time of presentation (prior to surgery).  Visits will involve review of systems and physical exam.  Any imaging would be indicated based on new symptoms or exam findings.   We reviewed signs and symptoms that should prompt a phone call before her next scheduled visit.  20 minutes of total time was spent for this patient encounter, including preparation, face-to-face counseling with the patient and coordination of care, and documentation of the encounter.  Eugene Garnet, MD  Division of Gynecologic Oncology  Department of Obstetrics and Gynecology  [facility]

## 2023-07-06 ENCOUNTER — Other Ambulatory Visit: Payer: No Typology Code available for payment source

## 2023-12-05 ENCOUNTER — Other Ambulatory Visit: Payer: Self-pay | Admitting: Family Medicine

## 2023-12-05 DIAGNOSIS — M8580 Other specified disorders of bone density and structure, unspecified site: Secondary | ICD-10-CM

## 2023-12-05 DIAGNOSIS — M810 Age-related osteoporosis without current pathological fracture: Secondary | ICD-10-CM
